# Patient Record
Sex: Female | Born: 1965
Health system: Southern US, Community
[De-identification: ages and names within clinical notes are randomized; demographics above are authoritative.]

## PROBLEM LIST (undated history)

## (undated) DIAGNOSIS — L309 Dermatitis, unspecified: Secondary | ICD-10-CM

## (undated) DIAGNOSIS — A4902 Methicillin resistant Staphylococcus aureus infection, unspecified site: Secondary | ICD-10-CM

---

## 2002-09-12 ENCOUNTER — Other Ambulatory Visit: Admission: RE | Admit: 2002-09-12 | Discharge: 2002-09-12 | Payer: Self-pay | Admitting: Obstetrics and Gynecology

## 2006-02-13 ENCOUNTER — Emergency Department (HOSPITAL_COMMUNITY): Admission: EM | Admit: 2006-02-13 | Discharge: 2006-02-13 | Payer: Self-pay | Admitting: *Deleted

## 2006-04-10 ENCOUNTER — Emergency Department (HOSPITAL_COMMUNITY): Admission: EM | Admit: 2006-04-10 | Discharge: 2006-04-10 | Payer: Self-pay | Admitting: Emergency Medicine

## 2006-06-21 ENCOUNTER — Emergency Department (HOSPITAL_COMMUNITY): Admission: EM | Admit: 2006-06-21 | Discharge: 2006-06-21 | Payer: Self-pay | Admitting: Emergency Medicine

## 2007-07-04 ENCOUNTER — Emergency Department (HOSPITAL_COMMUNITY): Admission: EM | Admit: 2007-07-04 | Discharge: 2007-07-04 | Payer: Self-pay | Admitting: Emergency Medicine

## 2007-07-07 ENCOUNTER — Ambulatory Visit (HOSPITAL_COMMUNITY): Admission: RE | Admit: 2007-07-07 | Discharge: 2007-07-07 | Payer: Self-pay | Admitting: Obstetrics

## 2008-02-02 ENCOUNTER — Emergency Department (HOSPITAL_COMMUNITY): Admission: EM | Admit: 2008-02-02 | Discharge: 2008-02-02 | Payer: Self-pay | Admitting: Emergency Medicine

## 2010-07-25 ENCOUNTER — Encounter: Payer: Self-pay | Admitting: Obstetrics

## 2010-07-26 ENCOUNTER — Encounter: Payer: Self-pay | Admitting: Obstetrics

## 2010-09-17 ENCOUNTER — Inpatient Hospital Stay (INDEPENDENT_AMBULATORY_CARE_PROVIDER_SITE_OTHER)
Admission: RE | Admit: 2010-09-17 | Discharge: 2010-09-17 | Disposition: A | Discharge status: Home / Self Care | Payer: Self-pay | Source: Ambulatory Visit | Attending: Family Medicine | Admitting: Family Medicine

## 2010-09-17 DIAGNOSIS — N760 Acute vaginitis: Secondary | ICD-10-CM

## 2010-09-17 LAB — POCT URINALYSIS DIPSTICK
Bilirubin Urine: NEGATIVE
Glucose, UA: NEGATIVE mg/dL
Hgb urine dipstick: NEGATIVE
Ketones, ur: 40 mg/dL — AB
Nitrite: NEGATIVE
Protein, ur: 30 mg/dL — AB
Specific Gravity, Urine: >=1.03 (ref 1.005–1.030)
Urobilinogen, UA: 2 mg/dL — ABNORMAL HIGH (ref 0.0–1.0)
pH: 6 (ref 5.0–8.0)

## 2010-09-17 LAB — POCT PREGNANCY, URINE: Preg Test, Ur: NEGATIVE

## 2010-09-17 LAB — WET PREP, GENITAL
Trich, Wet Prep: NONE SEEN
Yeast Wet Prep HPF POC: NONE SEEN

## 2010-09-18 LAB — GC/CHLAMYDIA PROBE AMP, GENITAL: Chlamydia, DNA Probe: NEGATIVE

## 2010-11-06 ENCOUNTER — Inpatient Hospital Stay (INDEPENDENT_AMBULATORY_CARE_PROVIDER_SITE_OTHER)
Admission: RE | Admit: 2010-11-06 | Discharge: 2010-11-06 | Disposition: A | Discharge status: Home / Self Care | Payer: Self-pay | Source: Ambulatory Visit | Attending: Emergency Medicine | Admitting: Emergency Medicine

## 2010-11-06 DIAGNOSIS — N390 Urinary tract infection, site not specified: Secondary | ICD-10-CM

## 2010-11-06 LAB — POCT URINALYSIS DIP (DEVICE)
Protein, ur: >=300 mg/dL — AB
Specific Gravity, Urine: 1.025 (ref 1.005–1.030)
Urobilinogen, UA: 1 mg/dL (ref 0.0–1.0)
pH: 5.5 (ref 5.0–8.0)

## 2010-11-06 LAB — POCT PREGNANCY, URINE: Preg Test, Ur: NEGATIVE

## 2012-04-29 ENCOUNTER — Emergency Department (INDEPENDENT_AMBULATORY_CARE_PROVIDER_SITE_OTHER)
Admission: EM | Admit: 2012-04-29 | Discharge: 2012-04-29 | Disposition: A | Discharge status: Home / Self Care | Payer: Self-pay | Source: Home / Self Care | Attending: Family Medicine | Admitting: Family Medicine

## 2012-04-29 ENCOUNTER — Encounter (HOSPITAL_COMMUNITY): Payer: Self-pay | Admitting: Emergency Medicine

## 2012-04-29 DIAGNOSIS — B86 Scabies: Secondary | ICD-10-CM

## 2012-04-29 DIAGNOSIS — N760 Acute vaginitis: Secondary | ICD-10-CM

## 2012-04-29 HISTORY — DX: Dermatitis, unspecified: L30.9

## 2012-04-29 LAB — POCT URINALYSIS DIP (DEVICE)
Bilirubin Urine: NEGATIVE
Ketones, ur: NEGATIVE mg/dL
Protein, ur: NEGATIVE mg/dL
Specific Gravity, Urine: 1.025 (ref 1.005–1.030)
pH: 5.5 (ref 5.0–8.0)

## 2012-04-29 LAB — WET PREP, GENITAL: Trich, Wet Prep: NONE SEEN

## 2012-04-29 MED ORDER — METRONIDAZOLE 500 MG PO TABS: 500.0000 mg | ORAL_TABLET | Freq: Two times a day (BID) | ORAL | Status: DC

## 2012-04-29 MED ORDER — IVERMECTIN 3 MG PO TABS: ORAL_TABLET | ORAL | Status: DC

## 2012-04-29 MED ORDER — FLUCONAZOLE 150 MG PO TABS: ORAL_TABLET | ORAL | Status: DC

## 2012-04-29 MED ORDER — PERMETHRIN 5 % EX CREA: TOPICAL_CREAM | CUTANEOUS | Status: DC

## 2012-04-29 NOTE — ED Notes (Signed)
Pt c/o multiple issues... UTI and vaginal discharge x2 months... Sx include: gray dishcarge, foul odor, dark/cloudy urine, lower back pain... Denies: fevers, vomiting, nauseas, diarrhea... Also c/o rash x3 weeks... Rash is on abd, back, and legs, chest... Pt is alert w/no signs of distress

## 2012-04-29 NOTE — ED Provider Notes (Signed)
History     CSN: [id number]  Arrival date & time 04/29/12  1191   First MD Initiated Contact with Patient 04/29/12 1011      Chief Complaint  Patient presents with  . Vaginal Discharge    (Consider location/radiation/quality/duration/timing/severity/associated sxs/prior treatment) HPI Comments: HPI Comments: 46 y/o female here with the following complaints: 1) Vaginal discharge itchiness and burning for 2 months associated with foul smell. Denies pelvic pain. Reports sporadic unprotected sex but currently abstinent for several months. Denies dysuria although reports concentrated dark urine, no hematuria, flank pain, fever or chills no urgency or frequency. 2) pruriginous rash in lower abdomen and extremities for 3 weeks other family hose contacts with similar symptoms. Has used permethrin last week with no resolution of rash.    Past Medical History  Diagnosis Date  . Eczema     History reviewed. No pertinent past surgical history.  No family history on file.  History  Substance Use Topics  . Smoking status: Never Smoker   . Smokeless tobacco: Not on file  . Alcohol Use: Yes    OB History    Grav Para Term Preterm Abortions TAB SAB Ect Mult Living                  Review of Systems  Constitutional: Negative for fever and chills.  Genitourinary: Positive for vaginal discharge. Negative for dysuria, urgency, frequency, hematuria, flank pain, vaginal bleeding and pelvic pain.  Neurological: Negative for dizziness and headaches.  All other systems reviewed and are negative.    Allergies  Review of patient's allergies indicates no known allergies.  Home Medications   Current Outpatient Rx  Name Route Sig Dispense Refill  . FLUCONAZOLE 150 MG PO TABS  1 tab by mouth q. 72 hours x2 2 tablet 00  . IVERMECTIN 3 MG PO TABS  3 tabs po x1 3 tablet 0  . METRONIDAZOLE 500 MG PO TABS Oral Take 1 tablet (500 mg total) by mouth 2 (two) times daily. 14 tablet 0  .  PERMETHRIN 5 % EX CREA  Apply to affected area once 60 g 1    BP 117/83  Pulse 85  Temp 98.1 F (36.7 C) (Oral)  Resp 18  SpO2 100%  LMP 03/27/2012  Physical Exam  Vitals reviewed. Constitutional: She is oriented to person, place, and time. She appears well-developed and well-nourished. No distress.  HENT:  Head: Normocephalic and atraumatic.  Eyes: Conjunctivae normal are normal. No scleral icterus.  Neck: Neck supple.  Cardiovascular: Normal rate, regular rhythm and normal heart sounds.   No murmur heard. Pulmonary/Chest: Breath sounds normal.  Abdominal: Soft. There is no tenderness. Hernia confirmed negative in the right inguinal area and confirmed negative in the left inguinal area.       No CVT  Genitourinary: There is no rash or lesion on the right labia. There is no rash or lesion on the left labia. Uterus is enlarged. Uterus is not tender. Cervix exhibits no motion tenderness and no friability. Right adnexum displays no mass, no tenderness and no fullness. Left adnexum displays no mass, no tenderness and no fullness. There is erythema around the vagina. Vaginal discharge found.    Lymphadenopathy:    She has no cervical adenopathy.       Right: No inguinal adenopathy present.       Left: No inguinal adenopathy present.  Neurological: She is alert and oriented to person, place, and time.  Skin: She is not diaphoretic.  scoriated pruriginous rash scattered in lower abdomen and distal extremities. No pustules or significant base erythema.     ED Course  Procedures (including critical care time)  Labs Reviewed  POCT URINALYSIS DIP (DEVICE) - Abnormal; Notable for the following:    Hgb urine dipstick TRACE (*)     Leukocytes, UA SMALL (*)  Biochemical Testing Only. Please order routine urinalysis from main lab if confirmatory testing is needed.   All other components within normal limits  WET PREP, GENITAL - Abnormal; Notable for the following:    Yeast Wet Prep  HPF POC MANY (*)     Clue Cells Wet Prep HPF POC MODERATE (*)     WBC, Wet Prep HPF POC MODERATE (*)     All other components within normal limits  POCT PREGNANCY, URINE  GC/CHLAMYDIA PROBE AMP, GENITAL   No results found.   1. Vaginitis   2. Scabies       MDM  Treated empirically with flagyl and diflucan. Gc/Chl pending.  Prescribed permethrin topical and ivermectin oral for resistant scabies. General household measures discussed with patient and provided in writing.         Sharin Grave, MD 04/30/12 [phone]

## 2012-05-01 LAB — GC/CHLAMYDIA PROBE AMP, GENITAL: Chlamydia, DNA Probe: NEGATIVE

## 2012-05-01 NOTE — ED Notes (Signed)
GC/Chlamydia neg., Wet prep: many yeast, mod. clue cells, mod. WBC's.  Pt. adequately treated with Diflucan and Flagyl. Elizabeth Livingston 05/01/2012

## 2013-04-23 ENCOUNTER — Emergency Department (HOSPITAL_COMMUNITY): Payer: Self-pay

## 2013-04-23 ENCOUNTER — Emergency Department (HOSPITAL_COMMUNITY)
Admission: EM | Admit: 2013-04-23 | Discharge: 2013-04-23 | Disposition: A | Discharge status: Home / Self Care | Payer: Self-pay | Attending: Emergency Medicine | Admitting: Emergency Medicine

## 2013-04-23 ENCOUNTER — Encounter (HOSPITAL_COMMUNITY): Payer: Self-pay | Admitting: Emergency Medicine

## 2013-04-23 DIAGNOSIS — M654 Radial styloid tenosynovitis [de Quervain]: Secondary | ICD-10-CM | POA: Insufficient documentation

## 2013-04-23 DIAGNOSIS — Z79899 Other long term (current) drug therapy: Secondary | ICD-10-CM | POA: Insufficient documentation

## 2013-04-23 DIAGNOSIS — Z872 Personal history of diseases of the skin and subcutaneous tissue: Secondary | ICD-10-CM | POA: Insufficient documentation

## 2013-04-23 MED ORDER — IBUPROFEN 400 MG PO TABS
800.0000 mg | ORAL_TABLET | Freq: Once | ORAL | Status: AC
  Administered 2013-04-23: 800 mg via ORAL
  Filled 2013-04-23: qty 2

## 2013-04-23 MED ORDER — NAPROXEN 500 MG PO TABS: 500.0000 mg | ORAL_TABLET | Freq: Two times a day (BID) | ORAL | Status: DC

## 2013-04-23 NOTE — ED Provider Notes (Signed)
CSN: [id number]     Arrival date & time 04/23/13  1027 History  This chart was scribed for non-physician practitioner Coral Ceo, PA-C, working with Flint Melter, MD by Dorothey Baseman, ED Scribe. This patient was seen in room TR06C/TR06C and the patient's care was started at 1:13 PM.    Chief Complaint  Patient presents with  . Wrist Pain    Right wrist   The history is provided by the patient. No language interpreter was used.   HPI Comments: Elizabeth Livingston is a 47 y.o. female who presents to the Emergency Department complaining of a constant progressively worsening pain to the right wrist onset 2 weeks ago. Patient denies any recent or potential injury to the area. Patient reports associated swelling to the area at night. 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 She also states that she will occasionally sleep with her wrist bent, holding her head up. She denies any numbness or weakness, loss of sensation, fever, cough, abdominal pain, nausea, or emesis. Patient denies any pertinent medical history.  Patient is right handed.     Past Medical History  Diagnosis Date  . Eczema    History reviewed. No pertinent past surgical history. No family history on file. History  Substance Use Topics  . Smoking status: Never Smoker   . Smokeless tobacco: Not on file  . Alcohol Use: Yes   OB History   Grav Para Term Preterm Abortions TAB SAB Ect Mult Living                 Review of Systems  Constitutional: Negative for fever, activity change, appetite change and fatigue.  HENT: Negative for rhinorrhea and sore throat.   Respiratory: Negative for cough.   Cardiovascular: Negative for chest pain.  Gastrointestinal: Negative for nausea, vomiting and abdominal pain.  Genitourinary: Negative for dysuria.  Musculoskeletal: Positive for arthralgias, joint swelling and myalgias.  Skin: Negative for color change  and wound.  Neurological: Negative for weakness and numbness.  All other systems reviewed and are negative.   Allergies  Review of patient's allergies indicates no known allergies.  Home Medications   Current Outpatient Rx  Name  Route  Sig  Dispense  Refill  . naproxen sodium (ANAPROX) 220 MG tablet   Oral   Take 440 mg by mouth daily as needed (for pain).          Triage Vitals: BP 129/96  Pulse 88  Resp 18  Ht 4' 11.5" (1.511 m)  Wt 131 lb 3 oz (59.506 kg)  BMI 26.06 kg/m2  SpO2 98%  LMP 03/25/2013  Filed Vitals:   04/23/13 1034 04/23/13 1035 04/23/13 1338  BP: 129/96  123/80  Pulse: 88  80  Temp:   98.3 F (36.8 C)  TempSrc:   Oral  Resp: 18    Height: 4' 11.5" (1.511 m)    Weight:  131 lb 3 oz (59.506 kg)   SpO2: 98%  96%    Physical Exam  Nursing note and vitals reviewed. Constitutional: She is oriented to person, place, and time. She appears well-developed and well-nourished. No distress.  HENT:  Head: Normocephalic and atraumatic.  Eyes: Conjunctivae are normal. Right eye exhibits no discharge. Left eye exhibits no discharge.  Neck: Normal range of motion. Neck supple.  Cardiovascular: Normal rate, regular rhythm and normal heart sounds.  Exam reveals no gallop and  no friction rub.   No murmur heard. Radial pulses present bilaterally  Pulmonary/Chest: Effort normal and breath sounds normal. No respiratory distress. She has no wheezes. She has no rales. She exhibits no tenderness.  Abdominal: Soft. She exhibits no distension. There is no tenderness.  Musculoskeletal: Normal range of motion. She exhibits tenderness. She exhibits no edema.  Positive Finkelstein's test on the right.  Tenderness to palpation overlying the 1st metacarpal on the right.  No snuff box tenderness.  Patient able to flex and extend right wrist without any pain or limitations.  Patient reports increased pain with abduction and adduction of the right wrist, however, has no limitations.   No tenderness to palpation to the wrist, forearm, or elbow on the right.  Grip strength 5/5 on the right.    Neurological: She is alert and oriented to person, place, and time.  Skin: Skin is warm and dry. She is not diaphoretic.  No edema, erythema, ecchymosis, or wounds to the right hand and wrist throughout  Psychiatric: She has a normal mood and affect. Her behavior is normal.    ED Course  Procedures (including critical care time)  DIAGNOSTIC STUDIES: Oxygen Saturation is 98% on room air, normal by my interpretation.    COORDINATION OF CARE: 1:15 PM- Discussed negative x-ray results and that symptoms are likely due to overuse. Will order ibuprofen and discharge patient with Naprosyn to manage symptoms. Advised patient to follow up with the referred hand surgeon if there are any new or worsening symptoms. Discussed treatment plan with patient at bedside and patient verbalized agreement.    Labs Review Labs Reviewed - No data to display  Imaging Review Dg Wrist Complete Right  04/23/2013   CLINICAL DATA:  Pain for 2 weeks, no known injury  EXAM: RIGHT WRIST - COMPLETE 3+ VIEW  COMPARISON:  None.  FINDINGS: There is no evidence of fracture or dislocation. There is no evidence of arthropathy or other focal bone abnormality. Soft tissues are unremarkable.  IMPRESSION: Negative.   Electronically Signed   By: Natasha Mead M.D.   On: 04/23/2013 12:44    EKG Interpretation   None       MDM   1. De Quervain's tenosynovitis, right    Elizabeth Livingston is a 47 y.o. female who presents to the Emergency Department complaining of a constant pain to the right wrist onset 2 weeks ago.  Ibuprofen ordered for pain.  X-rays right wrist ordered.    Rechecks  2:00 PM = Splint applied.  Neurovascularly intact.     Etiology of right wrist pain is possibly due to [employer].  Positive finkelstein's test.  Patient neurovascularly intact.  No evidence of fx or dislocation on x-ray.   Patient given Velcro thumb spica splint and work excuse.  She was instructed to follow-up with orthopedics.  Patient was prescribed Naprosyn for outpatient management.  Patient was instructed to return to the ED if they experience any weakness, loss of sensation, cyanosis, or other concerns.  Patient was in agreement with discharge and plan.     Final impressions: 1. De Quervain's tenosynovitis, right     Luiz Iron PA-C   I personally performed the services described in this documentation, which was scribed in my presence. The recorded information has been reviewed and is accurate.         Jillyn Ledger, PA-C 04/26/13 1355

## 2013-04-23 NOTE — ED Notes (Signed)
Pt c/o right wrist pain x 2 weeks. Pt denies recent injury. Pt tearful at triage. Pt reports her job requires here to use a hand held scanner and could be cause of her pain. Pt also reports sometime she sleeps with her wrist bent holding her head.

## 2013-04-23 NOTE — ED Notes (Signed)
Pt c/o right wrist pain x 2 weeks. Worse at night. Works using labeling gun.

## 2013-04-25 ENCOUNTER — Telehealth (HOSPITAL_COMMUNITY): Payer: Self-pay | Admitting: *Deleted

## 2013-04-25 NOTE — ED Notes (Signed)
Pt needs work note faxed to her job.

## 2013-04-25 NOTE — ED Notes (Signed)
Work note faxed to [employer] at [phone]

## 2013-04-27 NOTE — ED Provider Notes (Signed)
Medical screening examination/treatment/procedure(s) were performed by non-physician practitioner and as supervising physician I was immediately available for consultation/collaboration.  Flint Melter, MD 04/27/13 [phone]

## 2013-04-29 ENCOUNTER — Encounter (HOSPITAL_COMMUNITY): Payer: Self-pay | Admitting: Emergency Medicine

## 2013-04-29 ENCOUNTER — Emergency Department (HOSPITAL_COMMUNITY)
Admission: EM | Admit: 2013-04-29 | Discharge: 2013-04-30 | Disposition: A | Discharge status: Home / Self Care | Payer: Self-pay | Attending: Emergency Medicine | Admitting: Emergency Medicine

## 2013-04-29 DIAGNOSIS — J069 Acute upper respiratory infection, unspecified: Secondary | ICD-10-CM | POA: Insufficient documentation

## 2013-04-29 DIAGNOSIS — J029 Acute pharyngitis, unspecified: Secondary | ICD-10-CM | POA: Insufficient documentation

## 2013-04-29 DIAGNOSIS — Z791 Long term (current) use of non-steroidal anti-inflammatories (NSAID): Secondary | ICD-10-CM | POA: Insufficient documentation

## 2013-04-29 DIAGNOSIS — Z872 Personal history of diseases of the skin and subcutaneous tissue: Secondary | ICD-10-CM | POA: Insufficient documentation

## 2013-04-29 NOTE — ED Notes (Signed)
C/o nasal congestion, nasal congestion noted. Also hard to swallow. Throat was sore last night, denies throat pain at this time. Blowing nose in triage, denies fever.

## 2013-04-30 MED ORDER — IPRATROPIUM BROMIDE 0.06 % NA SOLN
2.0000 | Freq: Three times a day (TID) | NASAL | Status: DC
  Administered 2013-04-30: 2 via NASAL
  Filled 2013-04-30: qty 15

## 2013-04-30 NOTE — ED Provider Notes (Signed)
CSN: [id number]     Arrival date & time 04/29/13  2333 History   First MD Initiated Contact with Patient 04/29/13 2351     Chief Complaint  Patient presents with  . Nasal Congestion   (Consider location/radiation/quality/duration/timing/severity/associated sxs/prior Treatment) HPI Comments: Patient with 3, days of nasal congestion, states, that she is having hard time breathing through her nose.  Yesterday.  She had pain with swallowing, and can't "strangled" when she drinks something yesterday.  No further, episodes.  She's taken Advil with little relief of her congestion.  She tried Mucinex once without relief of her congestion.  Denies any fever or myalgias, coughing, shortness of breath  The history is provided by the patient.    Past Medical History  Diagnosis Date  . Eczema    History reviewed. No pertinent past surgical history. No family history on file. History  Substance Use Topics  . Smoking status: Never Smoker   . Smokeless tobacco: Not on file  . Alcohol Use: Yes   OB History   Grav Para Term Preterm Abortions TAB SAB Ect Mult Living                 Review of Systems  Unable to perform ROS Constitutional: Negative for fever and chills.  HENT: Positive for congestion and sore throat. Negative for ear pain, facial swelling, sinus pressure, sneezing and trouble swallowing.   Respiratory: Negative for cough and shortness of breath.   Neurological: Negative for dizziness and headaches.  All other systems reviewed and are negative.    Allergies  Review of patient's allergies indicates no known allergies.  Home Medications   Current Outpatient Rx  Name  Route  Sig  Dispense  Refill  . naproxen (NAPROSYN) 500 MG tablet   Oral   Take 1 tablet (500 mg total) by mouth 2 (two) times daily with a meal.   30 tablet   0   . naproxen sodium (ANAPROX) 220 MG tablet   Oral   Take 440 mg by mouth daily as needed (for pain).          BP 126/71  Temp(Src) 97.6  F (36.4 C) (Oral)  Resp 18  SpO2 98%  LMP 04/23/2013 Physical Exam  Nursing note and vitals reviewed. Constitutional: She appears well-developed and well-nourished.  HENT:  Head: Normocephalic.  Right Ear: External ear normal.  Left Ear: External ear normal.  Nose: Nose normal. No sinus tenderness. Right sinus exhibits no maxillary sinus tenderness and no frontal sinus tenderness. Left sinus exhibits no maxillary sinus tenderness and no frontal sinus tenderness.  Mouth/Throat: Posterior oropharyngeal erythema present. No oropharyngeal exudate or posterior oropharyngeal edema.  Neck: Normal range of motion.  Pulmonary/Chest: Effort normal. She has no wheezes. She exhibits no tenderness.  Lymphadenopathy:    She has no cervical adenopathy.  Neurological: She is alert.  Skin: Skin is warm.    ED Course  Procedures (including critical care time) Labs Review Labs Reviewed - No data to display Imaging Review No results found.  EKG Interpretation   None       MDM   1. URI (upper respiratory infection)     She has been provided with an Atrovent nasal spray to help with her nasal congestion    Arman Filter, NP 04/30/13 0017

## 2013-05-01 NOTE — ED Provider Notes (Signed)
Medical screening examination/treatment/procedure(s) were performed by non-physician practitioner and as supervising physician I was immediately available for consultation/collaboration.  EKG Interpretation   None         Laray Anger, DO 05/01/13 [phone]

## 2013-06-19 ENCOUNTER — Ambulatory Visit: Payer: Self-pay | Admitting: Internal Medicine

## 2013-10-14 ENCOUNTER — Encounter (HOSPITAL_COMMUNITY): Payer: Self-pay | Admitting: Emergency Medicine

## 2013-10-14 ENCOUNTER — Emergency Department (HOSPITAL_COMMUNITY)
Admission: EM | Admit: 2013-10-14 | Discharge: 2013-10-14 | Disposition: A | Discharge status: Home / Self Care | Payer: Self-pay | Attending: Emergency Medicine | Admitting: Emergency Medicine

## 2013-10-14 DIAGNOSIS — IMO0001 Reserved for inherently not codable concepts without codable children: Secondary | ICD-10-CM

## 2013-10-14 DIAGNOSIS — L03019 Cellulitis of unspecified finger: Principal | ICD-10-CM | POA: Insufficient documentation

## 2013-10-14 DIAGNOSIS — Z872 Personal history of diseases of the skin and subcutaneous tissue: Secondary | ICD-10-CM | POA: Insufficient documentation

## 2013-10-14 DIAGNOSIS — Z8614 Personal history of Methicillin resistant Staphylococcus aureus infection: Secondary | ICD-10-CM | POA: Insufficient documentation

## 2013-10-14 DIAGNOSIS — L02519 Cutaneous abscess of unspecified hand: Secondary | ICD-10-CM | POA: Insufficient documentation

## 2013-10-14 DIAGNOSIS — Z792 Long term (current) use of antibiotics: Secondary | ICD-10-CM | POA: Insufficient documentation

## 2013-10-14 MED ORDER — SULFAMETHOXAZOLE-TRIMETHOPRIM 800-160 MG PO TABS: 1.0000 | ORAL_TABLET | Freq: Two times a day (BID) | ORAL | Status: DC

## 2013-10-14 NOTE — Discharge Instructions (Signed)

## 2013-10-14 NOTE — ED Notes (Signed)
Patient presents to ED via POV with complaints of right hand index finger wound. Patient states she does not know how wound happened, "it just appeared over night" , pt states it had been about a week. Blister to index finger, pt states the blister popped but it came back.

## 2013-10-14 NOTE — ED Provider Notes (Signed)
Medical screening examination/treatment/procedure(s) were performed by non-physician practitioner and as supervising physician I was immediately available for consultation/collaboration.   EKG Interpretation None      Devoria AlbeIva Cambreigh Dearing, MD, Armando GangFACEP   Ward GivensIva L Jaleesa Cervi, MD 10/14/13 1544

## 2013-10-14 NOTE — ED Notes (Signed)
Pt discharged to home with family. NAD.  

## 2013-10-14 NOTE — ED Provider Notes (Signed)
CSN: [id number]     Arrival date & time 10/14/13  4098 History  This chart was scribed for non-physician practitioner, Junius Finner, PA-C working with Ward Givens, MD by Greggory Stallion, ED scribe. This patient was seen in room TR06C/TR06C and the patient's care was started at 8:58 AM.   Chief Complaint  Patient presents with  . Finger Injury   The history is provided by the patient. No language interpreter was used.   HPI Comments: Elizabeth Livingston is a 48 y.o. female with history of MRSA who presents to the Emergency Department complaining of proximal right index finger abscess that started 4 days ago. Pt states it drained pus, went away and returned a few days later. Pain is intermittent, moderate in severity, aching with occasional sharp pain.  Denies injury to her finger. She states she has gotten the same thing on her leg in the past. Denies fever. Denies history of diabetes.  Past Medical History  Diagnosis Date  . Eczema    History reviewed. No pertinent past surgical history. History reviewed. No pertinent family history. History  Substance Use Topics  . Smoking status: Never Smoker   . Smokeless tobacco: Not on file  . Alcohol Use: Yes   OB History   Grav Para Term Preterm Abortions TAB SAB Ect Mult Living                 Review of Systems  Constitutional: Negative for fever.  Skin:       Abscess.  All other systems reviewed and are negative.  Allergies  Review of patient's allergies indicates no known allergies.  Home Medications   Current Outpatient Rx  Name  Route  Sig  Dispense  Refill  . sulfamethoxazole-trimethoprim (SEPTRA DS) 800-160 MG per tablet   Oral   Take 1 tablet by mouth every 12 (twelve) hours.   10 tablet   0    BP 118/80  Pulse 72  Temp(Src) 98.4 F (36.9 C) (Oral)  Resp 20  Wt 123 lb 7 oz (55.991 kg)  SpO2 98%  Physical Exam  Nursing note and vitals reviewed. Constitutional: She is oriented to person, place, and time. She appears  well-developed and well-nourished.  HENT:  Head: Normocephalic and atraumatic.  Eyes: EOM are normal.  Neck: Normal range of motion.  Cardiovascular: Normal rate.   Pulmonary/Chest: Effort normal.  Musculoskeletal: Normal range of motion.  0.5 cm area of erythema and tenderness to right index finger. No active drainage or red streaking. Mild fluctuance. Full ROM. Capillary refill less than 2 seconds.   Neurological: She is alert and oriented to person, place, and time.  Skin: Skin is warm and dry.  Psychiatric: She has a normal mood and affect. Her behavior is normal.    ED Course  Procedures (including critical care time)  DIAGNOSTIC STUDIES: Oxygen Saturation is 98% on RA, normal by my interpretation.    COORDINATION OF CARE: 9:01 AM-Discussed treatment plan which includes I&D, Bactrim and warm compresses with pt at bedside and pt agreed to plan.    INCISION AND DRAINAGE Performed by: Junius Finner, PA-C Consent: Verbal consent obtained. Risks and benefits: risks, benefits and alternatives were discussed Type: abscess  Body area: right index finger  Anesthesia: local infiltration  Incision was made with a 21 gauge needle.  Local anesthetic: none  Anesthetic total: 0 ml  Complexity: complex  Drainage: purulent  Drainage amount: scant  Packing material: none  Patient tolerance: Patient tolerated the procedure  well with no immediate complications.  Labs Review Labs Reviewed - No data to display Imaging Review No results found.   EKG Interpretation None      MDM   Final diagnoses:  Abscess of second finger of right hand    Pt presenting with 0.5cm of proximal right index finger.  I&D successfully performed with 21gtt needle.  Bacitracin and bandage placed. Rx: bactrim. [facility] Return precautions provided. Pt verbalized understanding and agreement with tx plan.    I personally performed the  services described in this documentation, which was scribed in my presence. The recorded information has been reviewed and is accurate.  Junius Finnerrin O'Malley, PA-C 10/14/13 1055

## 2017-12-13 ENCOUNTER — Emergency Department (HOSPITAL_BASED_OUTPATIENT_CLINIC_OR_DEPARTMENT_OTHER)
Admission: EM | Admit: 2017-12-13 | Discharge: 2017-12-13 | Disposition: A | Discharge status: Home / Self Care | Payer: Self-pay | Attending: Emergency Medicine | Admitting: Emergency Medicine

## 2017-12-13 ENCOUNTER — Other Ambulatory Visit: Payer: Self-pay

## 2017-12-13 ENCOUNTER — Encounter (HOSPITAL_BASED_OUTPATIENT_CLINIC_OR_DEPARTMENT_OTHER): Payer: Self-pay

## 2017-12-13 DIAGNOSIS — F17200 Nicotine dependence, unspecified, uncomplicated: Secondary | ICD-10-CM | POA: Insufficient documentation

## 2017-12-13 DIAGNOSIS — R21 Rash and other nonspecific skin eruption: Secondary | ICD-10-CM

## 2017-12-13 HISTORY — DX: Methicillin resistant Staphylococcus aureus infection, unspecified site: A49.02

## 2017-12-13 MED ORDER — HYDROXYZINE HCL 25 MG PO TABS: 12.5000 mg | ORAL_TABLET | Freq: Three times a day (TID) | ORAL | 0 refills | Status: DC | PRN

## 2017-12-13 MED ORDER — CLOTRIMAZOLE 1 % EX CREA: TOPICAL_CREAM | CUTANEOUS | 0 refills | Status: AC

## 2017-12-13 NOTE — Discharge Instructions (Signed)
Follow up with your PCP this week.  If you develop worsening or new concerning symptoms you can return to the emergency department for re-evaluation.

## 2017-12-13 NOTE — ED Provider Notes (Signed)
[facility] EMERGENCY DEPARTMENT Provider Note   CSN: [id number] Arrival date & time: 12/13/17  2022     History   Chief Complaint Chief Complaint  Patient presents with  . Rash    HPI Elizabeth Livingston is a 52 y.o. female with a history of eczema who presents to the ED for rash.  She reports that she is noticed a scaly, pruritic rash on the trunk of her body over the last 2 weeks.  She reports she has been trying her hydrocortisone cream that she normally uses for eczema without any relief.  She states this is different than the eczema she has had in the past. Denies fever, chills, contacts with persons with similar rash, or any changes in lotions/soaps/detergents, exposure to animal or plant irritants. No swelling or purulent discharge. No redness, heat or warmth. No new medications. No recent travel. No recent tick bites. No involvement to palms/soles or between webspaces. Patient does not have history of  Immunocompromised. Denies hx of DM, chronic steroid use.  No lip swelling, tongue swelling, difficulty swallowing, difficult to breathing, abdominal cramping, nausea/vomiting/diarrhea.  HPI  Past Medical History:  Diagnosis Date  . Eczema   . MRSA (methicillin resistant Staphylococcus aureus)     There are no active problems to display for this patient.   History reviewed. No pertinent surgical history.   OB History   None      Home Medications    Prior to Admission medications   Not on File    Family History No family history on file.  Social History Social History   Tobacco Use  . Smoking status: Current Every Day Smoker  . Smokeless tobacco: Never Used  Substance Use Topics  . Alcohol use: Yes    Comment: occ  . Drug use: No     Allergies   Patient has no known allergies.   Review of Systems Review of Systems  All other systems reviewed and are negative.    Physical Exam Updated Vital Signs BP (!) 162/99 (BP Location: Left Arm)    Pulse 87   Temp 97.8 F (36.6 C) (Oral)   Resp 18   Ht 4\' 11"  (1.499 m)   Wt 59.4 kg (131 lb)   SpO2 99%   BMI 26.46 kg/m   Physical Exam  Constitutional: She appears well-developed and well-nourished.  HENT:  Head: Normocephalic and atraumatic.  Right Ear: External ear normal.  Left Ear: External ear normal.  Nose: Nose normal.  Mouth/Throat: Uvula is midline, oropharynx is clear and moist and mucous membranes are normal. No tonsillar exudate.  No angioedema.  No lip swelling, tongue swelling or uvular swelling.  Normal phonation.  Patient is in control secretions.  No stridor.  Eyes: Pupils are equal, round, and reactive to light. Right eye exhibits no discharge. Left eye exhibits no discharge. No scleral icterus.  Neck: Trachea normal. Neck supple. No spinous process tenderness present. No neck rigidity. Normal range of motion present.  Cardiovascular: Normal rate, regular rhythm and intact distal pulses.  No murmur heard. Pulses:      Radial pulses are 2+ on the right side, and 2+ on the left side.       Dorsalis pedis pulses are 2+ on the right side, and 2+ on the left side.       Posterior tibial pulses are 2+ on the right side, and 2+ on the left side.  No lower extremity swelling or edema. Calves symmetric in size  bilaterally.  Pulmonary/Chest: Effort normal and breath sounds normal. She exhibits no tenderness.  Abdominal: Soft. Bowel sounds are normal. There is no tenderness. There is no rebound and no guarding.  Musculoskeletal: She exhibits no edema.  Lymphadenopathy:    She has no cervical adenopathy.  Neurological: She is alert.  Skin: Skin is warm and dry. No rash noted. She is not diaphoretic.  Multiple areas on patients trunk with scaly, skin tone in appearance rings with central clearing. No blisters, no pustules, no warmth, no draining sinus tracts, no superficial abscesses, no bullous impetigo, no vesicles, no desquamation, no target lesions with dusky purpura  or a central bulla. Not tender to touch. No involvement of palms, soles or between webspace's.   Psychiatric: She has a normal mood and affect.  Nursing note and vitals reviewed.    ED Treatments / Results  Labs (all labs ordered are listed, but only abnormal results are displayed) Labs Reviewed - No data to display  EKG None  Radiology No results found.  Procedures Procedures (including critical care time)  Medications Ordered in ED Medications - No data to display   Initial Impression / Assessment and Plan / ED Course  I have reviewed the triage vital signs and the nursing notes.  Pertinent labs & imaging results that were available during my care of the patient were reviewed by me and considered in my medical decision making (see chart for details).     Rash consistent with Tinea. Patient denies any difficulty breathing or swallowing.  Pt has a patent airway without stridor and is handling secretions without difficulty; no angioedema. No blisters, no pustules, no warmth, no draining sinus tracts, no superficial abscesses, no bullous impetigo, no vesicles, no desquamation, no target lesions with dusky purpura or a central bulla. Not tender to touch. No concern for superimposed infection. No concern for SJS, TEN, TSS, tick borne illness, syphilis or other life-threatening condition. Will discharge home with antifungal cream and atarax (for itching). I advised the patient to follow-up with PCP this week. Specific return precautions discussed. Time was given for all questions to be answered. The patient verbalized understanding and agreement with plan. The patient appears safe for discharge home.  Final Clinical Impressions(s) / ED Diagnoses   Final diagnoses:  Rash    ED Discharge Orders        Ordered    clotrimazole (LOTRIMIN) 1 % cream     12/13/17 2346    hydrOXYzine (ATARAX/VISTARIL) 25 MG tablet  Every 8 hours PRN     12/13/17 2346       Princella PellegriniMaczis, Sequan Auxier M,  PA-C 12/14/17 1106    Tegeler, Canary Brimhristopher J, MD 12/18/17 1339

## 2017-12-13 NOTE — ED Triage Notes (Signed)
C/o scattered rash x 2 weeks-NAD-steady gait 

## 2017-12-13 NOTE — ED Notes (Signed)
Pt verbalizes understanding of dc instructions and denies any further needs at this time.  Pt is encouraged to establish herself with a PCP and shown the phone numbers listed in her dc paperwork.

## 2018-01-17 ENCOUNTER — Ambulatory Visit: Payer: Self-pay | Admitting: Family Medicine

## 2018-01-23 ENCOUNTER — Ambulatory Visit: Payer: BLUE CROSS/BLUE SHIELD | Admitting: Family Medicine

## 2018-01-23 ENCOUNTER — Encounter: Payer: Self-pay | Admitting: Family Medicine

## 2018-01-23 VITALS — BP 149/101 | HR 79 | Temp 97.9°F | Ht 59.5 in | Wt 134.0 lb

## 2018-01-23 DIAGNOSIS — I10 Essential (primary) hypertension: Secondary | ICD-10-CM | POA: Diagnosis not present

## 2018-01-23 DIAGNOSIS — R319 Hematuria, unspecified: Secondary | ICD-10-CM | POA: Diagnosis not present

## 2018-01-23 DIAGNOSIS — R21 Rash and other nonspecific skin eruption: Secondary | ICD-10-CM | POA: Diagnosis not present

## 2018-01-23 DIAGNOSIS — R8290 Unspecified abnormal findings in urine: Secondary | ICD-10-CM

## 2018-01-23 LAB — COMPREHENSIVE METABOLIC PANEL
ALK PHOS: 64 U/L (ref 39–117)
ALT: 19 U/L (ref 0–35)
AST: 12 U/L (ref 0–37)
Albumin: 4.3 g/dL (ref 3.5–5.2)
BILIRUBIN TOTAL: 0.6 mg/dL (ref 0.2–1.2)
BUN: 17 mg/dL (ref 6–23)
CALCIUM: 9.7 mg/dL (ref 8.4–10.5)
CO2: 30 mEq/L (ref 19–32)
Chloride: 104 mEq/L (ref 96–112)
Creatinine, Ser: 0.89 mg/dL (ref 0.40–1.20)
GFR: 85.7 mL/min (ref >60.00)
GLUCOSE: 81 mg/dL (ref 70–99)
POTASSIUM: 5.1 meq/L (ref 3.5–5.1)
Sodium: 140 mEq/L (ref 135–145)
TOTAL PROTEIN: 8.2 g/dL (ref 6.0–8.3)

## 2018-01-23 LAB — CBC WITH DIFFERENTIAL/PLATELET
BASOS ABS: 0 10*3/uL (ref 0.0–0.1)
Basophils Relative: 0.6 % (ref 0.0–3.0)
EOS PCT: 3.4 % (ref 0.0–5.0)
Eosinophils Absolute: 0.2 10*3/uL (ref 0.0–0.7)
HCT: 42.1 % (ref 36.0–46.0)
Hemoglobin: 13.9 g/dL (ref 12.0–15.0)
LYMPHS ABS: 2.6 10*3/uL (ref 0.7–4.0)
LYMPHS PCT: 35.5 % (ref 12.0–46.0)
MCHC: 33 g/dL (ref 30.0–36.0)
MCV: 97.2 fl (ref 78.0–100.0)
MONOS PCT: 11.5 % (ref 3.0–12.0)
Monocytes Absolute: 0.9 10*3/uL (ref 0.1–1.0)
NEUTROS PCT: 49 % (ref 43.0–77.0)
Neutro Abs: 3.6 10*3/uL (ref 1.4–7.7)
Platelets: 241 10*3/uL (ref 150.0–400.0)
RBC: 4.33 Mil/uL (ref 3.87–5.11)
RDW: 13.9 % (ref 11.5–15.5)
WBC: 7.4 10*3/uL (ref 4.0–10.5)

## 2018-01-23 LAB — TSH: TSH: 1.83 u[IU]/mL (ref 0.35–4.50)

## 2018-01-23 LAB — LIPID PANEL
Cholesterol: 161 mg/dL (ref 0–200)
HDL: 35.4 mg/dL — AB (ref >39.00)
LDL Cholesterol: 106 mg/dL — ABNORMAL HIGH (ref 0–99)
NONHDL: 125.55
TRIGLYCERIDES: 96 mg/dL (ref 0.0–149.0)
Total CHOL/HDL Ratio: 5
VLDL: 19.2 mg/dL (ref 0.0–40.0)

## 2018-01-23 MED ORDER — PREDNISONE 10 MG PO TABS: ORAL_TABLET | ORAL | 0 refills | Status: DC

## 2018-01-23 MED ORDER — DOXYCYCLINE HYCLATE 100 MG PO TABS: 100.0000 mg | ORAL_TABLET | Freq: Two times a day (BID) | ORAL | 0 refills | Status: DC

## 2018-01-23 MED ORDER — AMLODIPINE BESYLATE 2.5 MG PO TABS
2.5000 mg | ORAL_TABLET | Freq: Every day | ORAL | 3 refills | Status: DC
Start: 2018-01-23 — End: 2018-02-28

## 2018-01-23 NOTE — Patient Instructions (Addendum)
DASH Eating Plan DASH stands for "Dietary Approaches to Stop Hypertension." The DASH eating plan is a healthy eating plan that has been shown to reduce high blood pressure (hypertension). It may also reduce your risk for type 2 diabetes, heart disease, and stroke. The DASH eating plan may also help with weight loss. What are tips for following this plan? General guidelines  Avoid eating more than 2,300 mg (milligrams) of salt (sodium) a day. If you have hypertension, you may need to reduce your sodium intake to 1,500 mg a day.  Limit alcohol intake to no more than 1 drink a day for nonpregnant women and 2 drinks a day for men. One drink equals 12 oz of beer, 5 oz of wine, or 1 oz of hard liquor.  Work with your health care provider to maintain a healthy body weight or to lose weight. Ask what an ideal weight is for you.  Get at least 30 minutes of exercise that causes your heart to beat faster (aerobic exercise) most days of the week. Activities may include walking, swimming, or biking.  Work with your health care provider or diet and nutrition specialist (dietitian) to adjust your eating plan to your individual calorie needs. Reading food labels  Check food labels for the amount of sodium per serving. Choose foods with less than 5 percent of the Daily Value of sodium. Generally, foods with less than 300 mg of sodium per serving fit into this eating plan.  To find whole grains, look for the word "whole" as the first word in the ingredient list. Shopping  Buy products labeled as "low-sodium" or "no salt added."  Buy fresh foods. Avoid canned foods and premade or frozen meals. Cooking  Avoid adding salt when cooking. Use salt-free seasonings or herbs instead of table salt or sea salt. Check with your health care provider or pharmacist before using salt substitutes.  Do not fry foods. Cook foods using healthy methods such as baking, boiling, grilling, and broiling instead.  Cook with  heart-healthy oils, such as olive, canola, soybean, or sunflower oil. Meal planning   Eat a balanced diet that includes: ? 5 or more servings of fruits and vegetables each day. At each meal, try to fill half of your plate with fruits and vegetables. ? Up to 6-8 servings of whole grains each day. ? Less than 6 oz of lean meat, poultry, or fish each day. A 3-oz serving of meat is about the same size as a deck of cards. One egg equals 1 oz. ? 2 servings of low-fat dairy each day. ? A serving of nuts, seeds, or beans 5 times each week. ? Heart-healthy fats. Healthy fats called Omega-3 fatty acids are found in foods such as flaxseeds and coldwater fish, like sardines, salmon, and mackerel.  Limit how much you eat of the following: ? Canned or prepackaged foods. ? Food that is high in trans fat, such as fried foods. ? Food that is high in saturated fat, such as fatty meat. ? Sweets, desserts, sugary drinks, and other foods with added sugar. ? Full-fat dairy products.  Do not salt foods before eating.  Try to eat at least 2 vegetarian meals each week.  Eat more home-cooked food and less restaurant, buffet, and fast food.  When eating at a restaurant, ask that your food be prepared with less salt or no salt, if possible. What foods are recommended? The items listed may not be a complete list. Talk with your dietitian about what   dietary choices are best for you. Grains Whole-grain or whole-wheat bread. Whole-grain or whole-wheat pasta. Brown rice. Oatmeal. Quinoa. Bulgur. Whole-grain and low-sodium cereals. Pita bread. Low-fat, low-sodium crackers. Whole-wheat flour tortillas. Vegetables Fresh or frozen vegetables (raw, steamed, roasted, or grilled). Low-sodium or reduced-sodium tomato and vegetable juice. Low-sodium or reduced-sodium tomato sauce and tomato paste. Low-sodium or reduced-sodium canned vegetables. Fruits All fresh, dried, or frozen fruit. Canned fruit in natural juice (without  added sugar). Meat and other protein foods Skinless chicken or turkey. Ground chicken or turkey. Pork with fat trimmed off. Fish and seafood. Egg whites. Dried beans, peas, or lentils. Unsalted nuts, nut butters, and seeds. Unsalted canned beans. Lean cuts of beef with fat trimmed off. Low-sodium, lean deli meat. Dairy Low-fat (1%) or fat-free (skim) milk. Fat-free, low-fat, or reduced-fat cheeses. Nonfat, low-sodium ricotta or cottage cheese. Low-fat or nonfat yogurt. Low-fat, low-sodium cheese. Fats and oils Soft margarine without trans fats. Vegetable oil. Low-fat, reduced-fat, or light mayonnaise and salad dressings (reduced-sodium). Canola, safflower, olive, soybean, and sunflower oils. Avocado. Seasoning and other foods Herbs. Spices. Seasoning mixes without salt. Unsalted popcorn and pretzels. Fat-free sweets. What foods are not recommended? The items listed may not be a complete list. Talk with your dietitian about what dietary choices are best for you. Grains Baked goods made with fat, such as croissants, muffins, or some breads. Dry pasta or rice meal packs. Vegetables Creamed or fried vegetables. Vegetables in a cheese sauce. Regular canned vegetables (not low-sodium or reduced-sodium). Regular canned tomato sauce and paste (not low-sodium or reduced-sodium). Regular tomato and vegetable juice (not low-sodium or reduced-sodium). Pickles. Olives. Fruits Canned fruit in a light or heavy syrup. Fried fruit. Fruit in cream or butter sauce. Meat and other protein foods Fatty cuts of meat. Ribs. Fried meat. Bacon. Sausage. Bologna and other processed lunch meats. Salami. Fatback. Hotdogs. Bratwurst. Salted nuts and seeds. Canned beans with added salt. Canned or smoked fish. Whole eggs or egg yolks. Chicken or turkey with skin. Dairy Whole or 2% milk, cream, and half-and-half. Whole or full-fat cream cheese. Whole-fat or sweetened yogurt. Full-fat cheese. Nondairy creamers. Whipped toppings.  Processed cheese and cheese spreads. Fats and oils Butter. Stick margarine. Lard. Shortening. Ghee. Bacon fat. Tropical oils, such as coconut, palm kernel, or palm oil. Seasoning and other foods Salted popcorn and pretzels. Onion salt, garlic salt, seasoned salt, table salt, and sea salt. Worcestershire sauce. Tartar sauce. Barbecue sauce. Teriyaki sauce. Soy sauce, including reduced-sodium. Steak sauce. Canned and packaged gravies. Fish sauce. Oyster sauce. Cocktail sauce. Horseradish that you find on the shelf. Ketchup. Mustard. Meat flavorings and tenderizers. Bouillon cubes. Hot sauce and Tabasco sauce. Premade or packaged marinades. Premade or packaged taco seasonings. Relishes. Regular salad dressings. Where to find more information:  National Heart, Lung, and Blood Institute: www.nhlbi.nih.gov  American Heart Association: www.heart.org Summary  The DASH eating plan is a healthy eating plan that has been shown to reduce high blood pressure (hypertension). It may also reduce your risk for type 2 diabetes, heart disease, and stroke.  With the DASH eating plan, you should limit salt (sodium) intake to 2,300 mg a day. If you have hypertension, you may need to reduce your sodium intake to 1,500 mg a day.  When on the DASH eating plan, aim to eat more fresh fruits and vegetables, whole grains, lean proteins, low-fat dairy, and heart-healthy fats.  Work with your health care provider or diet and nutrition specialist (dietitian) to adjust your eating plan to your individual   calorie needs. This information is not intended to replace advice given to you by your health care provider. Make sure you discuss any questions you have with your health care provider. Document Released: 06/10/2011 Document Revised: 06/14/2016 Document Reviewed: 06/14/2016 Elsevier Interactive Patient Education  2018 [employer]        Rash A rash is a change in the color of the skin. A rash can also change the way  your skin feels. There are many different conditions and factors that can cause a rash. Follow these instructions at home: Pay attention to any changes in your symptoms. Follow these instructions to help with your condition: Medicine Take or apply over-the-counter and prescription medicines only as told by your health care provider. These may include:  Corticosteroid cream.  Anti-itch lotions.  Oral antihistamines.  Skin Care  Apply cool compresses to the affected areas.  Try taking a bath with: ? Epsom salts. Follow the instructions on the packaging. You can get these at your local pharmacy or grocery store. ? Baking soda. Pour a small amount into the bath as told by your health care provider. ? Colloidal oatmeal. Follow the instructions on the packaging. You can get this at your local pharmacy or grocery store.  Try applying baking soda paste to your skin. Stir water into baking soda until it reaches a paste-like consistency.  Do not scratch or rub your skin.  Avoid covering the rash. Make sure the rash is exposed to air as much as possible. General instructions  Avoid hot showers or baths, which can make itching worse. A cold shower may help.  Avoid scented soaps, detergents, and perfumes. Use gentle soaps, detergents, perfumes, and other cosmetic products.  Avoid any substance that causes your rash. Keep a journal to help track what causes your rash. Write down: ? What you eat. ? What cosmetic products you use. ? What you drink. ? What you wear. This includes jewelry.  Keep all follow-up visits as told by your health care provider. This is important. Contact a health care provider if:  You sweat at night.  You lose weight.  You urinate more than normal.  You feel weak.  You vomit.  Your skin or the whites of your eyes look yellow (jaundice).  Your skin: ? Tingles. ? Is numb.  Your rash: ? Does not go away after several days. ? Gets worse.  You  are: ? Unusually thirsty. ? More tired than normal.  You have: ? New symptoms. ? Pain in your abdomen. ? A fever. ? Diarrhea. Get help right away if:  You develop a rash that covers all or most of your body. The rash may or may not be painful.  You develop blisters that: ? Are on top of the rash. ? Grow larger or grow together. ? Are painful. ? Are inside your nose or mouth.  You develop a rash that: ? Looks like purple pinprick-sized spots all over your body. ? Has a "bull's eye" or looks like a target. ? Is not related to sun exposure, is red and painful, and causes your skin to peel. This information is not intended to replace advice given to you by your health care provider. Make sure you discuss any questions you have with your health care provider. Document Released: 06/11/2002 Document Revised: 11/25/2015 Document Reviewed: 11/06/2014 Elsevier Interactive Patient Education  2018 [employer]

## 2018-01-23 NOTE — Assessment & Plan Note (Signed)
Poorly controlled will alter medications, encouraged DASH diet, minimize caffeine and obtain adequate sleep. Report concerning symptoms and follow up as directed and as needed\ meds started today rto 2-3 weeks or sooner prn

## 2018-01-23 NOTE — Progress Notes (Signed)
Patient ID: Elizabeth ShellGabocia L Moomaw, female    DOB: 1965/08/18  Age: 52 y.o. MRN: [id number]    Subjective:  Subjective  HPI  Elizabeth Livingston presents to establish as a new pt and c/o  rash that has been on low back , legs , low abd  ---  She was given a cream, lotrisone and atarax but she did not use it   Rash is very itchy.   She would like to be referred to derm asap. Pt also c/o dark urine with odor.   No d/c.  She is not drinking much water.    Review of Systems  Constitutional: Negative for appetite change, diaphoresis, fatigue and unexpected weight change.  Eyes: Negative for pain, redness and visual disturbance.  Respiratory: Negative for cough, chest tightness, shortness of breath and wheezing.   Cardiovascular: Negative for chest pain, palpitations and leg swelling.  Endocrine: Negative for cold intolerance, heat intolerance, polydipsia, polyphagia and polyuria.  Genitourinary: Negative for difficulty urinating, dysuria and frequency.  Skin: Positive for rash.  Neurological: Negative for dizziness, light-headedness, numbness and headaches.    History Past Medical History:  Diagnosis Date  . Eczema   . MRSA (methicillin resistant Staphylococcus aureus)     She has no past surgical history on file.   Her family history includes Diabetes in her maternal grandmother; Hypertension in her brother and mother; Kidney failure in her brother; Stroke in her mother.She reports that she has been smoking.  She has never used smokeless tobacco. She reports that she drinks alcohol. She reports that she does not use drugs.  Current Outpatient Medications on File Prior to Visit  Medication Sig Dispense Refill  . clotrimazole (LOTRIMIN) 1 % cream Apply to affected area 2 times daily (Patient not taking: Reported on 01/23/2018) 15 g 0  . hydrOXYzine (ATARAX/VISTARIL) 25 MG tablet Take 0.5-1 tablets (12.5-25 mg total) by mouth every 8 (eight) hours as needed for itching. (Patient not taking: Reported  on 01/23/2018) 30 tablet 0   No current facility-administered medications on file prior to visit.      Objective:  Objective  Physical Exam  Constitutional: She is oriented to person, place, and time. She appears well-developed and well-nourished.  HENT:  Head: Normocephalic and atraumatic.  Eyes: Conjunctivae and EOM are normal.  Neck: Normal range of motion. Neck supple. No JVD present. Carotid bruit is not present. No thyromegaly present.  Cardiovascular: Normal rate, regular rhythm and normal heart sounds.  No murmur heard. Pulmonary/Chest: Effort normal and breath sounds normal. No respiratory distress. She has no wheezes. She has no rales. She exhibits no tenderness.  Musculoskeletal: She exhibits no edema.  Neurological: She is alert and oriented to person, place, and time.  Skin: Rash noted. Rash is maculopapular.     Psychiatric: She has a normal mood and affect.  Nursing note and vitals reviewed.  BP (!) 149/101 (BP Location: Left Arm, Patient Position: Sitting, Cuff Size: Normal)   Pulse 79   Temp 97.9 F (36.6 C) (Oral)   Ht 4' 11.5" (1.511 m)   Wt 134 lb (60.8 kg)   SpO2 100%   BMI 26.61 kg/m  Wt Readings from Last 3 Encounters:  01/23/18 134 lb (60.8 kg)  12/13/17 131 lb (59.4 kg)  10/14/13 123 lb 7 oz (56 kg)     Lab Results  Component Value Date   WBC 7.4 01/23/2018   HGB 13.9 01/23/2018   HCT 42.1 01/23/2018   PLT 241.0 01/23/2018  GLUCOSE 81 01/23/2018   CHOL 161 01/23/2018   TRIG 96.0 01/23/2018   HDL 35.40 (L) 01/23/2018   LDLCALC 106 (H) 01/23/2018   ALT 19 01/23/2018   AST 12 01/23/2018   NA 140 01/23/2018   K 5.1 01/23/2018   CL 104 01/23/2018   CREATININE 0.89 01/23/2018   BUN 17 01/23/2018   CO2 30 01/23/2018   TSH 1.83 01/23/2018    No results found.   Assessment & Plan:  Plan  I am having Elizabeth Livingston start on doxycycline, predniSONE, and amLODipine. I am also having her maintain her clotrimazole and  hydrOXYzine.  Meds ordered this encounter  Medications  . doxycycline (VIBRA-TABS) 100 MG tablet    Sig: Take 1 tablet (100 mg total) by mouth 2 (two) times daily.    Dispense:  20 tablet    Refill:  0  . predniSONE (DELTASONE) 10 MG tablet    Sig: TAKE 3 TABLETS PO QD FOR 3 DAYS THEN TAKE 2 TABLETS PO QD FOR 3 DAYS THEN TAKE 1 TABLET PO QD FOR 3 DAYS THEN TAKE 1/2 TAB PO QD FOR 3 DAYS    Dispense:  20 tablet    Refill:  0  . amLODipine (NORVASC) 2.5 MG tablet    Sig: Take 1 tablet (2.5 mg total) by mouth daily.    Dispense:  30 tablet    Refill:  3    Problem List Items Addressed This Visit      Unprioritized   Essential hypertension    Poorly controlled will alter medications, encouraged DASH diet, minimize caffeine and obtain adequate sleep. Report concerning symptoms and follow up as directed and as needed\ meds started today rto 2-3 weeks or sooner prn       Relevant Medications   amLODipine (NORVASC) 2.5 MG tablet   Other Relevant Orders   Lipid panel (Completed)   CBC with Differential/Platelet (Completed)   Comprehensive metabolic panel (Completed)   TSH (Completed)    Other Visit Diagnoses    Rash    -  Primary   Relevant Medications   doxycycline (VIBRA-TABS) 100 MG tablet   predniSONE (DELTASONE) 10 MG tablet   Other Relevant Orders   Ambulatory referral to Dermatology   Bad odor of urine       Relevant Orders   POCT Urinalysis Dipstick (Automated)   Hematuria, unspecified type       Relevant Orders   Urine Culture      Follow-up: Return in about 2 weeks (around 02/06/2018), or if symptoms worsen or fail to improve, for bp check .  Donato Schultz, DO

## 2018-01-24 LAB — POC URINALSYSI DIPSTICK (AUTOMATED)
BILIRUBIN UA: NEGATIVE
Glucose, UA: NEGATIVE
Ketones, UA: NEGATIVE
LEUKOCYTES UA: NEGATIVE
NITRITE UA: NEGATIVE
Protein, UA: POSITIVE — AB
Spec Grav, UA: 1.025 (ref 1.010–1.025)
UROBILINOGEN UA: 0.2 U/dL
pH, UA: 6 (ref 5.0–8.0)

## 2018-01-25 LAB — URINE CULTURE
MICRO NUMBER:: 90864443
SPECIMEN QUALITY:: ADEQUATE

## 2018-01-27 ENCOUNTER — Ambulatory Visit: Payer: Self-pay | Admitting: Family Medicine

## 2018-01-27 MED ORDER — SULFAMETHOXAZOLE-TRIMETHOPRIM 400-80 MG PO TABS: 1.0000 | ORAL_TABLET | Freq: Two times a day (BID) | ORAL | 0 refills | Status: DC

## 2018-01-27 NOTE — Addendum Note (Signed)
Addended by: Crissie SicklesARTER, Jonavan Vanhorn A on: 01/27/2018 11:25 AM   Modules accepted: Orders

## 2018-01-27 NOTE — Telephone Encounter (Signed)
Pt called in with question regarding her antibiotic that I was not able to answer and could not find the conversation she was referring to.     She said she talked with someone from the office but I could not find any documentation.  I connected her with the flow coordinator with [facility] and they are going to assist her.

## 2018-02-01 ENCOUNTER — Ambulatory Visit: Payer: Self-pay | Admitting: *Deleted

## 2018-02-01 NOTE — Telephone Encounter (Signed)
Returning call to patient regarding a rash.  No answer/unable to leave voice mail or message.

## 2018-02-03 ENCOUNTER — Other Ambulatory Visit: Payer: Self-pay | Admitting: Family Medicine

## 2018-02-03 ENCOUNTER — Ambulatory Visit: Payer: Self-pay | Admitting: *Deleted

## 2018-02-03 ENCOUNTER — Ambulatory Visit: Payer: BLUE CROSS/BLUE SHIELD | Admitting: Family Medicine

## 2018-02-03 ENCOUNTER — Ambulatory Visit (INDEPENDENT_AMBULATORY_CARE_PROVIDER_SITE_OTHER): Payer: BLUE CROSS/BLUE SHIELD | Admitting: Family Medicine

## 2018-02-03 VITALS — BP 132/85 | HR 75

## 2018-02-03 DIAGNOSIS — R21 Rash and other nonspecific skin eruption: Secondary | ICD-10-CM

## 2018-02-03 DIAGNOSIS — I10 Essential (primary) hypertension: Secondary | ICD-10-CM

## 2018-02-03 MED ORDER — HYDROXYZINE HCL 25 MG PO TABS: 12.5000 mg | ORAL_TABLET | Freq: Three times a day (TID) | ORAL | 0 refills | Status: DC | PRN

## 2018-02-03 NOTE — Telephone Encounter (Signed)
Please get appointment sooner somewhere else --- see pt phone note

## 2018-02-03 NOTE — Telephone Encounter (Signed)
Per Dedra SkeensGwen, referral coordinator, pt. has [facility] app[facility]. Author phoned pt. to review, VM full, unable to leave message.

## 2018-02-03 NOTE — Progress Notes (Signed)
Reviewed  Yvonne R Lowne Chase, DO  

## 2018-02-03 NOTE — Progress Notes (Signed)
Pre visit review using our clinic tool,if applicable. No additional management support is needed unless otherwise documented below in the visit note.   Pt here for Blood pressure check per order from Dr. Zola ButtonLowne-Chase dated 01/23/18. Patient had appointment with provider today but arrived too late.  Pt currently takes: Amlodipine 25 mg daily for High Blood Pressure management.  Pt reports compliance with medication. Patient complains of Rash on her leg. Checked with referral coordinator advised that she had to switch Dermatologists but patient should hear from the soon regarding an appointment. Refill submitted on patients Hydroxzine 25 mg for itching per ok from Dr. Zola ButtonLowne-Chase.  BP today  = 132/85 P = 75  Pt advised per to continue BP medication as ordered. Schedule for follow up appointment. Patient agreed.

## 2018-02-03 NOTE — Telephone Encounter (Signed)
Pt called regarding rash, she states that she is still itching and the rash is still there";the pt also says "it looks different in different places" she also says that she is finished with the prednisone and is still taking vibramycin; the pt is also upset that she is not scheduled to see dermatology until 06/29/18; she states that "this is unacceptable, and she needs to see a dermatologist sooner"; recommendations made per nurse triage to include seeing the physician within 24 hours; pt offered and accepted appointment with Dr Zola ButtonLowne-Chase today at 1000; she verbalizes understanding; will route to office for notification of this upcoming appointment; also [facility] with [facility]ily[facility], regarding this encounter.    Reason for Disposition . Hives or itching . SEVERE itching (i.e., interferes with sleep, normal activities or school)  Answer Assessment - Initial Assessment Questions 1. APPEARANCE of RASH: "Describe the rash." (e.g., spots, blisters, raised areas, skin peeling, scaly)     Spots are dry, red ,patches; looks different in different palces 2. SIZE: "How big are the spots?" (e.g., tip of pen, eraser, coin; inches, centimeters)     Unable to access 3. LOCATION: "Where is the rash located?"     All over her body 4. COLOR: "What color is the rash?" (Note: It is difficult to assess rash color in people with darker-colored skin. When this situation occurs, simply ask the caller to describe what they see.)     Spots are dry, red ,patches; looks different in different palces 5. ONSET: "When did the rash begin?"  seen in office 01/23/18 6. FEVER: "Do you have a fever?" If so, ask: "What is your temperature, how was it measured, and when did it start?"     no 7. ITCHING: "Does the rash itch?" If so, ask: "How bad is the itch?" (Scale 1-10; or mild, moderate, severe)     moderate 8. CAUSE: "What do you think is causing the rash?"     unsure 9. NEW MEDICATION: "What new medication are you  taking?" (e.g., name of antibiotic) "When did you start taking this medication?".     Doxycycline 100 mg twice daily starting 01/23/18 and Bactrim 400/80 1 tablet twice daily starting 01/27/18 10. OTHER SYMPTOMS: "Do you have any other symptoms?" (e.g., sore throat, fever, joint pain)       none 11. PREGNANCY: "Is there any chance you are pregnant?" "When was your last menstrual period?"       no  Answer Assessment - Initial Assessment Questions 1. APPEARANCE of RASH: "Describe the rash." (e.g., spots, blisters, raised areas, skin peeling, scaly)     Spots are dry, red ,patches; looks different in different palces 2. SIZE: "How big are the spots?" (e.g., tip of pen, eraser, coin; inches, centimeters)     Unable to access 3. LOCATION: "Where is the rash located?"     All over her body 4. COLOR: "What color is the rash?" (Note: It is difficult to assess rash color in people with darker-colored skin. When this situation occurs, simply ask the caller to describe what they see.)     Spots are dry, red ,patches; looks different in different palces 5. ONSET: "When did the rash begin?"  seen in office 01/23/18 6. FEVER: "Do you have a fever?" If so, ask: "What is your temperature, how was it measured, and when did it start?"     no 7. ITCHING: "Does the rash itch?" If so, ask: "How bad is the itch?" (Scale 1-10; or mild,  moderate, severe)     moderate 8. CAUSE: "What do you think is causing the rash?"     unsure 9. NEW MEDICATION: "What new medication are you taking?" (e.g., name of antibiotic) "When did you start taking this medication?".     Doxycycline 100 mg twice daily starting 01/23/18 and Bactrim 400/80 1 tablet twice daily starting 01/27/18 10. OTHER SYMPTOMS: "Do you have any other symptoms?" (e.g., sore throat, fever, joint pain)       none 11. PREGNANCY: "Is there any chance you are pregnant?" "When was your last menstrual period?"       no  Protocols used: RASH - WIDESPREAD ON  [id number], RASH OR REDNESS - [facility]

## 2018-02-03 NOTE — Telephone Encounter (Signed)
Pt. Calling re: unimproved rash, and is upset because her dermatology appointment is not until December. Pt is requesting to be referred to a different dermatology office and/or requesting dermatology consult be changed to urgent status. Pt. Is scheduled for an OV with Dr. Laury AxonLowne today at 10AM. Dr. Laury AxonLowne and CMA, Festus Barrenanesha, made aware.

## 2018-02-06 ENCOUNTER — Ambulatory Visit: Payer: BLUE CROSS/BLUE SHIELD

## 2018-02-09 ENCOUNTER — Telehealth: Payer: Self-pay

## 2018-02-09 ENCOUNTER — Telehealth: Payer: Self-pay | Admitting: *Deleted

## 2018-02-09 DIAGNOSIS — R21 Rash and other nonspecific skin eruption: Secondary | ICD-10-CM

## 2018-02-09 NOTE — Telephone Encounter (Signed)
Pt. called, stating that her rash has not improved since the bactrim was prescribed. She finished the entire course. Pt. stated [facility] but she keeps missing them, and would like the office to call. Pt. also requesting [facility] to [facility] [place], [place], but pt. unsure which street it is on, so she will call author back with that information tomorrow pt. States. Hydroxyzine was originally ordered 8/2 and sent [facility] local [facility], but it was a print Rx, and the pharmacy denies having received a faxed rx per pt. Author phoned [facility] to make an appointment for pt. Appointment [facility] for [facility] Monday, September 9th for 115PM, which was the earliest appointment they had for urgent referrals. Practice is at [address], [place], [place] [id number]. If pt. needs to call back, she can call #[phone] and [facility] for [facility].  Author will call pt. tomorrow to relay appointment date and retrieve appropriate pharmacy for hydroxyzine rx.

## 2018-02-09 NOTE — Telephone Encounter (Signed)
Copied from CRM [id number]. Topic: Inquiry >> Feb 09, 2018 12:49 PM Windy KalataMichael, Taylor L, NT wrote: Reason for CRM: patient is calling and is needing a refill on a medication but is unsure of the name of the medication. She states it was for a rash. She is requesting a call back from Dr. Laury AxonLowne nurse.

## 2018-02-09 NOTE — Telephone Encounter (Signed)
[facility]

## 2018-02-10 MED ORDER — HYDROXYZINE HCL 25 MG PO TABS
12.5000 mg | ORAL_TABLET | Freq: Three times a day (TID) | ORAL | 0 refills | Status: AC | PRN
Start: 2018-02-10 — End: ?

## 2018-02-10 NOTE — Telephone Encounter (Signed)
Pt. phoned author back, and relayed the pharmacy address to which she would like her hydroxyzine sent. [facility]oned [facility] in [place] to confirm that they never received the rx per pt. Report, which they did confirm. Author told pt. About 9/9 appointment with [facility] at 115PM, and appointment with Dr. Laury [facility]Lowne at [facility] the same day. Pt appreciative of help, no other concerns at this time.

## 2018-02-28 ENCOUNTER — Telehealth: Payer: Self-pay | Admitting: Family Medicine

## 2018-02-28 DIAGNOSIS — I10 Essential (primary) hypertension: Secondary | ICD-10-CM

## 2018-02-28 MED ORDER — AMLODIPINE BESYLATE 2.5 MG PO TABS: 2.5000 mg | ORAL_TABLET | Freq: Every day | ORAL | 2 refills | Status: DC

## 2018-02-28 NOTE — Telephone Encounter (Signed)
Copied from CRM [id number]. Topic: Quick Communication - Rx Refill/Question >> Feb 28, 2018 11:43 AM Oneal GroutSebastian, Jennifer S wrote: Medication: amLODipine (NORVASC) 2.5 MG tablet, only has 1 pill left  Has the patient contacted their pharmacy? Yes.  New pharmacy (Agent: If no, request that the patient contact the pharmacy for the refill.) (Agent: If yes, when and what did the pharmacy advise?)  Preferred Pharmacy (with phone number or street name):[facility] [place], [place] - [address]  Agent: Please be advised that RX refills may take up to 3 business days. We ask that you follow-up with your pharmacy.

## 2018-02-28 NOTE — Telephone Encounter (Signed)
Request for Amlodipine Rx to be sent to a different pharmacy.   LRF 01/23/18; #30; RF x 3 LOV 02/03/18 Pharm[facility] [address], [place], [place]    Refill sent through to new pharm. per pt. Request.

## 2018-03-13 ENCOUNTER — Ambulatory Visit: Payer: BLUE CROSS/BLUE SHIELD | Admitting: Family Medicine

## 2018-03-14 ENCOUNTER — Ambulatory Visit: Payer: BLUE CROSS/BLUE SHIELD | Admitting: Family Medicine

## 2018-03-14 ENCOUNTER — Other Ambulatory Visit (HOSPITAL_COMMUNITY)
Admission: RE | Admit: 2018-03-14 | Discharge: 2018-03-14 | Disposition: A | Discharge status: Home / Self Care | Payer: BLUE CROSS/BLUE SHIELD | Source: Ambulatory Visit | Attending: Family Medicine | Admitting: Family Medicine

## 2018-03-14 ENCOUNTER — Encounter: Payer: Self-pay | Admitting: Family Medicine

## 2018-03-14 VITALS — BP 136/79 | HR 88 | Temp 97.7°F | Resp 16 | Ht 60.0 in | Wt 136.2 lb

## 2018-03-14 DIAGNOSIS — N898 Other specified noninflammatory disorders of vagina: Secondary | ICD-10-CM | POA: Insufficient documentation

## 2018-03-14 DIAGNOSIS — Z87440 Personal history of urinary (tract) infections: Secondary | ICD-10-CM | POA: Diagnosis not present

## 2018-03-14 LAB — POC URINALSYSI DIPSTICK (AUTOMATED)
BILIRUBIN UA: NEGATIVE
Glucose, UA: NEGATIVE
Ketones, UA: NEGATIVE
Leukocytes, UA: NEGATIVE
NITRITE UA: NEGATIVE
Protein, UA: NEGATIVE
RBC UA: NEGATIVE
Urobilinogen, UA: 0.2 E.U./dL
pH, UA: 6 (ref 5.0–8.0)

## 2018-03-14 MED ORDER — FLUCONAZOLE 150 MG PO TABS: 150.0000 mg | ORAL_TABLET | Freq: Once | ORAL | 1 refills | Status: AC

## 2018-03-14 NOTE — Patient Instructions (Signed)

## 2018-03-14 NOTE — Progress Notes (Signed)
Patient ID: Elizabeth Livingston, female    DOB: 1965-08-23  Age: 52 y.o. MRN: [id number]    Subjective:  Subjective  HPI Elizabeth Livingston presents for vaginal itching , with white d/c and odor.  No abd pain.  Pt would like std testing but will return for blood work   Review of Systems  Constitutional: Negative for chills and fever.  HENT: Negative for congestion and hearing loss.   Eyes: Negative for discharge.  Respiratory: Negative for cough and shortness of breath.   Cardiovascular: Negative for chest pain, palpitations and leg swelling.  Gastrointestinal: Negative for abdominal pain, blood in stool, constipation, diarrhea, nausea and vomiting.  Genitourinary: Positive for vaginal discharge. Negative for dysuria, frequency, hematuria and urgency.  Musculoskeletal: Negative for back pain and myalgias.  Skin: Negative for rash.  Allergic/Immunologic: Negative for environmental allergies.  Neurological: Negative for dizziness, weakness and headaches.  Hematological: Does not bruise/bleed easily.  Psychiatric/Behavioral: Negative for suicidal ideas. The patient is not nervous/anxious.     History Past Medical History:  Diagnosis Date  . Eczema   . MRSA (methicillin resistant Staphylococcus aureus)     She has no past surgical history on file.   Her family history includes Diabetes in her maternal grandmother; Hypertension in her brother and mother; Kidney failure in her brother; Stroke in her mother.She reports that she has been smoking. She has never used smokeless tobacco. She reports that she drinks alcohol. She reports that she does not use drugs.  Current Outpatient Medications on File Prior to Visit  Medication Sig Dispense Refill  . amLODipine (NORVASC) 2.5 MG tablet Take 1 tablet (2.5 mg total) by mouth daily. 30 tablet 2  . clotrimazole (LOTRIMIN) 1 % cream Apply to affected area 2 times daily 15 g 0  . hydrOXYzine (ATARAX/VISTARIL) 25 MG tablet Take 0.5-1 tablets (12.5-25  mg total) by mouth every 8 (eight) hours as needed for itching. 30 tablet 0   No current facility-administered medications on file prior to visit.      Objective:  Objective  Physical Exam  Constitutional: She is oriented to person, place, and time. She appears well-developed and well-nourished.  HENT:  Head: Normocephalic and atraumatic.  Eyes: Conjunctivae and EOM are normal.  Neck: Normal range of motion. Neck supple. No JVD present. Carotid bruit is not present. No thyromegaly present.  Cardiovascular: Normal rate, regular rhythm and normal heart sounds.  No murmur heard. Pulmonary/Chest: Effort normal and breath sounds normal. No respiratory distress. She has no wheezes. She has no rales. She exhibits no tenderness.  Abdominal: Soft. She exhibits no distension. There is no tenderness. There is no rebound, no guarding and no CVA tenderness.  Genitourinary: Pelvic exam was performed with patient supine. There is no rash, tenderness, lesion or injury on the right labia. There is no rash, tenderness, lesion or injury on the left labia. Cervix exhibits discharge. There is tenderness in the vagina. No erythema in the vagina. Vaginal discharge found.  Musculoskeletal: She exhibits no edema.  Neurological: She is alert and oriented to person, place, and time.  Skin:     Psychiatric: She has a normal mood and affect.  Nursing note and vitals reviewed.  BP 136/79 (BP Location: Right Arm, Cuff Size: Normal)   Pulse 88   Temp 97.7 F (36.5 C) (Oral)   Resp 16   Ht 5' (1.524 m)   Wt 136 lb 3.2 oz (61.8 kg)   SpO2 100%   BMI 26.60 kg/m  Wt Readings from Last 3 Encounters:  03/14/18 136 lb 3.2 oz (61.8 kg)  01/23/18 134 lb (60.8 kg)  12/13/17 131 lb (59.4 kg)     Lab Results  Component Value Date   WBC 7.4 01/23/2018   HGB 13.9 01/23/2018   HCT 42.1 01/23/2018   PLT 241.0 01/23/2018   GLUCOSE 81 01/23/2018   CHOL 161 01/23/2018   TRIG 96.0 01/23/2018   HDL 35.40 (L)  01/23/2018   LDLCALC 106 (H) 01/23/2018   ALT 19 01/23/2018   AST 12 01/23/2018   NA 140 01/23/2018   K 5.1 01/23/2018   CL 104 01/23/2018   CREATININE 0.89 01/23/2018   BUN 17 01/23/2018   CO2 30 01/23/2018   TSH 1.83 01/23/2018    No results found.   Assessment & Plan:  Plan  I have discontinued Elizabeth Livingston doxycycline, predniSONE, and sulfamethoxazole-trimethoprim. I am also having her start on fluconazole. Additionally, I am having her maintain her clotrimazole, hydrOXYzine, and amLODipine.  Meds ordered this encounter  Medications  . fluconazole (DIFLUCAN) 150 MG tablet    Sig: Take 1 tablet (150 mg total) by mouth once for 1 dose. May repeat in 3 days prn    Dispense:  2 tablet    Refill:  1    Problem List Items Addressed This Visit    None    Visit Diagnoses    Vaginal itching    -  Primary   Relevant Medications   fluconazole (DIFLUCAN) 150 MG tablet   Other Relevant Orders   Cervicovaginal ancillary only   History of UTI       Relevant Medications   fluconazole (DIFLUCAN) 150 MG tablet   Other Relevant Orders   POCT Urinalysis Dipstick (Automated) (Completed)       UA normal  Follow-up: Return if symptoms worsen or fail to improve.  Donato Schultz, DO

## 2018-03-16 ENCOUNTER — Other Ambulatory Visit: Payer: Self-pay | Admitting: Family Medicine

## 2018-03-16 DIAGNOSIS — B9689 Other specified bacterial agents as the cause of diseases classified elsewhere: Secondary | ICD-10-CM

## 2018-03-16 DIAGNOSIS — N760 Acute vaginitis: Secondary | ICD-10-CM

## 2018-03-16 LAB — CERVICOVAGINAL ANCILLARY ONLY
Bacterial vaginitis: POSITIVE — AB
CANDIDA VAGINITIS: POSITIVE — AB
CHLAMYDIA, DNA PROBE: NEGATIVE
NEISSERIA GONORRHEA: NEGATIVE
Trichomonas: NEGATIVE

## 2018-03-16 MED ORDER — FLUCONAZOLE 150 MG PO TABS: 150.0000 mg | ORAL_TABLET | Freq: Once | ORAL | 0 refills | Status: AC

## 2018-03-16 MED ORDER — METRONIDAZOLE 500 MG PO TABS: 500.0000 mg | ORAL_TABLET | Freq: Two times a day (BID) | ORAL | 0 refills | Status: DC

## 2018-03-20 ENCOUNTER — Other Ambulatory Visit: Payer: Self-pay | Admitting: Family Medicine

## 2018-03-20 ENCOUNTER — Telehealth: Payer: Self-pay | Admitting: Family Medicine

## 2018-03-20 DIAGNOSIS — B9689 Other specified bacterial agents as the cause of diseases classified elsewhere: Secondary | ICD-10-CM

## 2018-03-20 DIAGNOSIS — N760 Acute vaginitis: Principal | ICD-10-CM

## 2018-03-20 MED ORDER — METRONIDAZOLE 0.75 % VA GEL: 1.0000 | Freq: Every day | VAGINAL | 0 refills | Status: DC

## 2018-03-20 NOTE — Telephone Encounter (Signed)
Copied from CRM #[id number]. [id number]: Quick Communication - See Telephone Encounter >> Mar 20, 2018  2:06 PM Elizabeth KalataMichael, Elizabeth Livingston, NT wrote: CRM for notification. See Telephone encounter for: 03/20/18.  Patient is calling and states on 03/14/18 she was seen for a yeast infection but on 03/16/18 and antibiotic was called in for metroNIDAZOLE (FLAGYL) 500 MG tablet. She has not received her results yet and would like a call back today as she works the rest of the week and is concerned about her test.

## 2018-03-20 NOTE — Telephone Encounter (Signed)
changed

## 2018-03-20 NOTE — Telephone Encounter (Signed)
Patient would like the cream sent in instead of taking the pills for bv.

## 2018-03-21 NOTE — Telephone Encounter (Signed)
Left detailed message on machine that cream was sent in.

## 2018-03-22 MED ORDER — FLUCONAZOLE 150 MG PO TABS: ORAL_TABLET | ORAL | 0 refills | Status: DC

## 2018-03-22 NOTE — Addendum Note (Signed)
Addended by: Crissie SicklesARTER, Jahziel Sinn A on: 03/22/2018 07:54 AM   Modules accepted: Orders

## 2018-04-05 ENCOUNTER — Ambulatory Visit: Payer: BLUE CROSS/BLUE SHIELD | Admitting: Medical

## 2018-04-10 ENCOUNTER — Other Ambulatory Visit (HOSPITAL_COMMUNITY)
Admission: RE | Admit: 2018-04-10 | Discharge: 2018-04-10 | Disposition: A | Discharge status: Home / Self Care | Payer: BLUE CROSS/BLUE SHIELD | Source: Ambulatory Visit | Attending: Family Medicine | Admitting: Family Medicine

## 2018-04-10 ENCOUNTER — Ambulatory Visit (INDEPENDENT_AMBULATORY_CARE_PROVIDER_SITE_OTHER): Payer: BLUE CROSS/BLUE SHIELD | Admitting: Family Medicine

## 2018-04-10 ENCOUNTER — Encounter: Payer: Self-pay | Admitting: Family Medicine

## 2018-04-10 VITALS — BP 129/72 | HR 84 | Temp 97.9°F | Resp 16 | Ht 60.0 in | Wt 134.8 lb

## 2018-04-10 DIAGNOSIS — N760 Acute vaginitis: Secondary | ICD-10-CM | POA: Insufficient documentation

## 2018-04-10 DIAGNOSIS — Z1239 Encounter for other screening for malignant neoplasm of breast: Secondary | ICD-10-CM | POA: Diagnosis not present

## 2018-04-10 DIAGNOSIS — Z0000 Encounter for general adult medical examination without abnormal findings: Secondary | ICD-10-CM | POA: Insufficient documentation

## 2018-04-10 DIAGNOSIS — R21 Rash and other nonspecific skin eruption: Secondary | ICD-10-CM

## 2018-04-10 DIAGNOSIS — R200 Anesthesia of skin: Secondary | ICD-10-CM

## 2018-04-10 DIAGNOSIS — Z1211 Encounter for screening for malignant neoplasm of colon: Secondary | ICD-10-CM

## 2018-04-10 DIAGNOSIS — L439 Lichen planus, unspecified: Secondary | ICD-10-CM | POA: Diagnosis not present

## 2018-04-10 DIAGNOSIS — I10 Essential (primary) hypertension: Secondary | ICD-10-CM

## 2018-04-10 DIAGNOSIS — H547 Unspecified visual loss: Secondary | ICD-10-CM

## 2018-04-10 DIAGNOSIS — R202 Paresthesia of skin: Secondary | ICD-10-CM | POA: Diagnosis not present

## 2018-04-10 MED ORDER — AMLODIPINE BESYLATE 2.5 MG PO TABS: 2.5000 mg | ORAL_TABLET | Freq: Every day | ORAL | 1 refills | Status: AC

## 2018-04-10 NOTE — Progress Notes (Signed)
Subjective:     Elizabeth Livingston is a 52 y.o. female and is here for a comprehensive physical exam. The patient reports problems - pt dx with lichen planus .  She is getting worse and would like a second opinion  She also c/o numbness hands and feet .     Social History   Socioeconomic History  . Marital status: Single    Spouse name: Not on file  . Number of children: Not on file  . Years of education: Not on file  . Highest education level: Not on file  Occupational History  . Not on file  Social Needs  . Financial resource strain: Not on file  . Food insecurity:    Worry: Not on file    Inability: Not on file  . Transportation needs:    Medical: Not on file    Non-medical: Not on file  Tobacco Use  . Smoking status: Current Every Day Smoker  . Smokeless tobacco: Never Used  Substance and Sexual Activity  . Alcohol use: Yes    Comment: occ  . Drug use: No  . Sexual activity: Not on file  Lifestyle  . Physical activity:    Days per week: Not on file    Minutes per session: Not on file  . Stress: Not on file  Relationships  . Social connections:    Talks on phone: Not on file    Gets together: Not on file    Attends religious service: Not on file    Active member of club or organization: Not on file    Attends meetings of clubs or organizations: Not on file    Relationship status: Not on file  . Intimate partner violence:    Fear of current or ex partner: Not on file    Emotionally abused: Not on file    Physically abused: Not on file    Forced sexual activity: Not on file  Other Topics Concern  . Not on file  Social History Narrative  . Not on file   Health Maintenance  Topic Date Due  . TETANUS/TDAP  03/14/1985  . PAP SMEAR  03/15/1987  . MAMMOGRAM  07/06/2008  . COLONOSCOPY  03/14/2016  . INFLUENZA VACCINE  02/02/2018  . HIV Screening  04/11/2019 (Originally 03/14/1981)    The following portions of the patient's history were reviewed and updated as  appropriate:  She  has a past medical history of Eczema and MRSA (methicillin resistant Staphylococcus aureus). She does not have any pertinent problems on file. She  has no past surgical history on file. Her family history includes Diabetes in her maternal grandmother; Hypertension in her brother and mother; Kidney failure in her brother; Lupus in her maternal aunt; Stroke in her mother. She  reports that she has been smoking. She has never used smokeless tobacco. She reports that she drinks alcohol. She reports that she does not use drugs. She has a current medication list which includes the following prescription(s): amlodipine, clotrimazole, fluocinonide cream, and hydroxyzine. Current Outpatient Medications on File Prior to Visit  Medication Sig Dispense Refill  . amLODipine (NORVASC) 2.5 MG tablet Take 1 tablet (2.5 mg total) by mouth daily. 30 tablet 2  . clotrimazole (LOTRIMIN) 1 % cream Apply to affected area 2 times daily 15 g 0  . fluocinonide cream (LIDEX) 0.05 % APP EXT AA BID  1  . hydrOXYzine (ATARAX/VISTARIL) 25 MG tablet Take 0.5-1 tablets (12.5-25 mg total) by mouth every 8 (eight)  hours as needed for itching. 30 tablet 0   No current facility-administered medications on file prior to visit.    She has No Known Allergies..  Review of Systems Review of Systems  Constitutional: Negative for activity change, appetite change and fatigue.  HENT: Negative for hearing loss, congestion, tinnitus and ear discharge.  dentist q70m Eyes: Negative for visual disturbance (see optho q1y -- vision corrected to 20/20 with glasses).  Respiratory: Negative for cough, chest tightness and shortness of breath.   Cardiovascular: Negative for chest pain, palpitations and leg swelling.  Gastrointestinal: Negative for abdominal pain, diarrhea, constipation and abdominal distention.  Genitourinary: Negative for urgency, frequency, decreased urine volume and difficulty urinating.  Musculoskeletal:  Negative for back pain, arthralgias and gait problem.  Skin: Negative for color change, pallor and rash.  Neurological: Negative for dizziness, light-headedness, numbness and headaches.  Hematological: Negative for adenopathy. Does not bruise/bleed easily.  Psychiatric/Behavioral: Negative for suicidal ideas, confusion, sleep disturbance, self-injury, dysphoric mood, decreased concentration and agitation.       Objective:    BP 129/72 (BP Location: Right Arm, Cuff Size: Normal)   Pulse 84   Temp 97.9 F (36.6 C) (Oral)   Resp 16   Ht 5' (1.524 m)   Wt 134 lb 12.8 oz (61.1 kg)   SpO2 100%   BMI 26.33 kg/m  General appearance: alert, cooperative, appears stated age and no distress Head: Normocephalic, without obvious abnormality, atraumatic Eyes: conjunctivae/corneas clear. PERRL, EOM's intact. Fundi benign. Ears: normal TM's and external ear canals both ears Nose: Nares normal. Septum midline. Mucosa normal. No drainage or sinus tenderness. Throat: lips, mucosa, and tongue normal; teeth and gums normal Neck: no adenopathy, no carotid bruit, no JVD, supple, symmetrical, trachea midline and thyroid not enlarged, symmetric, no tenderness/mass/nodules Back: symmetric, no curvature. ROM normal. No CVA tenderness. Lungs: clear to auscultation bilaterally Breasts: normal appearance, no masses or tenderness Heart: regular rate and rhythm, S1, S2 normal, no murmur, click, rub or gallop Abdomen: soft, non-tender; bowel sounds normal; no masses,  no organomegaly Pelvic: cervix normal in appearance, external genitalia normal, no adnexal masses or tenderness, no cervical motion tenderness, rectovaginal septum normal, uterus normal size, shape, and consistency, vagina normal without discharge and pap done, rectal heme neg brown stool Extremities: extremities normal, atraumatic, no cyanosis or edema Pulses: 2+ and symmetric Skin: Skin color, texture, turgor normal. No rashes or lesions Lymph  nodes: Cervical, supraclavicular, and axillary nodes normal. Neurologic: Alert and oriented X 3, normal strength and tone. Normal symmetric reflexes. Normal coordination and gait    Assessment:    Healthy female exam.      Plan:    ghm utd Check labs See After Visit Summary for Counseling Recommendations    1. Rash con't --- pt requesting second opinion - Ambulatory referral to Dermatology - Antinuclear Antib (ANA) - Sedimentation rate  2. Lichen planus  - Ambulatory referral to Dermatology  3. Preventative health care ghm utd Check labs See AVS - Ambulatory referral to Gastroenterology - Cytology - PAP  4. Breast cancer screening   - MM 3D SCREEN BREAST BILATERAL; Future  5. Poor vision  - Ambulatory referral to Ophthalmology  6. Essential hypertension Well controlled, no changes to meds. Encouraged heart healthy diet such as the DASH diet and exercise as tolerated.   - amLODipine (NORVASC) 2.5 MG tablet; Take 1 tablet (2.5 mg total) by mouth daily.  Dispense: 90 tablet; Refill: 1  7. Numbness and tingling in both hands  -  Ambulatory referral to Neurology - Vitamin B12  8. Numbness and tingling of both feet   - Ambulatory referral to Neurology - Vitamin B12

## 2018-04-10 NOTE — Patient Instructions (Signed)
Preventive Care 40-64 Years, Female Preventive care refers to lifestyle choices and visits with your health care provider that can promote health and wellness. What does preventive care include?  A yearly physical exam. This is also called an annual well check.  Dental exams once or twice a year.  Routine eye exams. Ask your health care provider how often you should have your eyes checked.  Personal lifestyle choices, including: ? Daily care of your teeth and gums. ? Regular physical activity. ? Eating a healthy diet. ? Avoiding tobacco and drug use. ? Limiting alcohol use. ? Practicing safe sex. ? Taking low-dose aspirin daily starting at age 58. ? Taking vitamin and mineral supplements as recommended by your health care provider. What happens during an annual well check? The services and screenings done by your health care provider during your annual well check will depend on your age, overall health, lifestyle risk factors, and family history of disease. Counseling Your health care provider may ask you questions about your:  Alcohol use.  Tobacco use.  Drug use.  Emotional well-being.  Home and relationship well-being.  Sexual activity.  Eating habits.  Work and work Statistician.  Method of birth control.  Menstrual cycle.  Pregnancy history.  Screening You may have the following tests or measurements:  Height, weight, and BMI.  Blood pressure.  Lipid and cholesterol levels. These may be checked every 5 years, or more frequently if you are over 81 years old.  Skin check.  Lung cancer screening. You may have this screening every year starting at age 78 if you have a 30-pack-year history of smoking and currently smoke or have quit within the past 15 years.  Fecal occult blood test (FOBT) of the stool. You may have this test every year starting at age 65.  Flexible sigmoidoscopy or colonoscopy. You may have a sigmoidoscopy every 5 years or a colonoscopy  every 10 years starting at age 30.  Hepatitis C blood test.  Hepatitis B blood test.  Sexually transmitted disease (STD) testing.  Diabetes screening. This is done by checking your blood sugar (glucose) after you have not eaten for a while (fasting). You may have this done every 1-3 years.  Mammogram. This may be done every 1-2 years. Talk to your health care provider about when you should start having regular mammograms. This may depend on whether you have a family history of breast cancer.  BRCA-related cancer screening. This may be done if you have a family history of breast, ovarian, tubal, or peritoneal cancers.  Pelvic exam and Pap test. This may be done every 3 years starting at age 80. Starting at age 36, this may be done every 5 years if you have a Pap test in combination with an HPV test.  Bone density scan. This is done to screen for osteoporosis. You may have this scan if you are at high risk for osteoporosis.  Discuss your test results, treatment options, and if necessary, the need for more tests with your health care provider. Vaccines Your health care provider may recommend certain vaccines, such as:  Influenza vaccine. This is recommended every year.  Tetanus, diphtheria, and acellular pertussis (Tdap, Td) vaccine. You may need a Td booster every 10 years.  Varicella vaccine. You may need this if you have not been vaccinated.  Zoster vaccine. You may need this after age 5.  Measles, mumps, and rubella (MMR) vaccine. You may need at least one dose of MMR if you were born in  1957 or later. You may also need a second dose.  Pneumococcal 13-valent conjugate (PCV13) vaccine. You may need this if you have certain conditions and were not previously vaccinated.  Pneumococcal polysaccharide (PPSV23) vaccine. You may need one or two doses if you smoke cigarettes or if you have certain conditions.  Meningococcal vaccine. You may need this if you have certain  conditions.  Hepatitis A vaccine. You may need this if you have certain conditions or if you travel or work in places where you may be exposed to hepatitis A.  Hepatitis B vaccine. You may need this if you have certain conditions or if you travel or work in places where you may be exposed to hepatitis B.  Haemophilus influenzae type b (Hib) vaccine. You may need this if you have certain conditions.  Talk to your health care provider about which screenings and vaccines you need and how often you need them. This information is not intended to replace advice given to you by your health care provider. Make sure you discuss any questions you have with your health care provider. Document Released: 07/18/2015 Document Revised: 03/10/2016 Document Reviewed: 04/22/2015 Elsevier Interactive Patient Education  [employer].

## 2018-04-12 LAB — CYTOLOGY - PAP
Bacterial vaginitis: POSITIVE — AB
CANDIDA VAGINITIS: NEGATIVE
CHLAMYDIA, DNA PROBE: NEGATIVE
DIAGNOSIS: NEGATIVE
HPV: NOT DETECTED
Neisseria Gonorrhea: NEGATIVE
Trichomonas: NEGATIVE

## 2018-04-12 LAB — POC HEMOCCULT BLD/STL (OFFICE/1-CARD/DIAGNOSTIC)
Card #1 Date: 10072019
Fecal Occult Blood, POC: NEGATIVE

## 2018-04-12 NOTE — Addendum Note (Signed)
Addended by: Thelma Barge D on: 04/12/2018 09:02 AM   Modules accepted: Orders

## 2018-04-14 ENCOUNTER — Other Ambulatory Visit: Payer: Self-pay | Admitting: *Deleted

## 2018-04-14 DIAGNOSIS — N760 Acute vaginitis: Principal | ICD-10-CM

## 2018-04-14 DIAGNOSIS — B9689 Other specified bacterial agents as the cause of diseases classified elsewhere: Secondary | ICD-10-CM

## 2018-04-14 MED ORDER — METRONIDAZOLE 0.75 % VA GEL: 1.0000 | Freq: Every day | VAGINAL | 0 refills | Status: AC

## 2018-04-15 ENCOUNTER — Ambulatory Visit (HOSPITAL_BASED_OUTPATIENT_CLINIC_OR_DEPARTMENT_OTHER): Payer: BLUE CROSS/BLUE SHIELD

## 2018-04-17 ENCOUNTER — Other Ambulatory Visit: Payer: BLUE CROSS/BLUE SHIELD

## 2018-04-18 ENCOUNTER — Encounter: Payer: Self-pay | Admitting: Neurology

## 2018-07-03 ENCOUNTER — Ambulatory Visit: Payer: BLUE CROSS/BLUE SHIELD | Admitting: Advanced Practice Midwife

## 2018-07-16 NOTE — Progress Notes (Deleted)
[facility] Clinic Note - Initial Visit   Date: 07/16/18  Elizabeth Livingston MRN: [id number] DOB: 05/13/1966   Dear Dr. Zola ButtonLowne Chase:   Thank you for your kind referral of Elizabeth Livingston for consultation of paresthesias of the hands and feet. Although her history is well known to you, please allow us to reiterate it for the purpose of our medical record. The patient was accompanied to the clinic by *** who also provides collateral information.     History of Present Illness: Elizabeth Livingston is a 53 y.o. ***-handed Caucasian/*** female with hypertension and eczema presenting for evaluation of numbness and tingling of the hands and feet..    She started experiencing numbness of the hands and feet ***.  Symptoms are *** and improved by ***.   She denies any weakness, back pain, neck pain, or falls.  Out-side paper records, electronic medical record, and images have been reviewed where available and summarized as: *** Lab Results  Component Value Date   TSH 1.83 01/23/2018    Past Medical History:  Diagnosis Date  . Eczema   . MRSA (methicillin resistant Staphylococcus aureus)     No past surgical history on file.   Medications:  Outpatient Encounter Medications as of 07/17/2018  Medication Sig  . amLODipine (NORVASC) 2.5 MG tablet Take 1 tablet (2.5 mg total) by mouth daily.  . clotrimazole (LOTRIMIN) 1 % cream Apply to affected area 2 times daily  . fluocinonide cream (LIDEX) 0.05 % APP EXT AA BID  . hydrOXYzine (ATARAX/VISTARIL) 25 MG tablet Take 0.5-1 tablets (12.5-25 mg total) by mouth every 8 (eight) hours as needed for itching.   No facility-administered encounter medications on file as of 07/17/2018.      Allergies: No Known Allergies  Family History: Family History  Problem Relation Age of Onset  . Hypertension Mother   . Stroke Mother   . Hypertension Brother   . Kidney failure Brother   . Diabetes Maternal Grandmother   .  Lupus Maternal Aunt     Social History: Social History   Tobacco Use  . Smoking status: Current Every Day Smoker  . Smokeless tobacco: Never Used  Substance Use Topics  . Alcohol use: Yes    Comment: occ  . Drug use: No   Social History   Social History Narrative  . Not on file    Review of Systems:  CONSTITUTIONAL: No fevers, chills, night sweats, or weight loss.  *** EYES: No visual changes or eye pain ENT: No hearing changes.  No history of nose bleeds.   RESPIRATORY: No cough, wheezing and shortness of breath.   CARDIOVASCULAR: Negative for chest pain, and palpitations.   GI: Negative for abdominal discomfort, blood in stools or black stools.  No recent change in bowel habits.   GU:  No history of incontinence.   MUSCLOSKELETAL: No history of joint pain or swelling.  No myalgias.   SKIN: Negative for lesions, rash, and itching.   HEMATOLOGY/ONCOLOGY: Negative for prolonged bleeding, bruising easily, and swollen nodes.  No history of cancer.   ENDOCRINE: Negative for cold or heat intolerance, polydipsia or goiter.   PSYCH:  ***depression or anxiety symptoms.   NEURO: As Above.   Vital Signs:  There were no vitals taken for this visit.   General Medical Exam:   General:  Well appearing, comfortable.   Eyes/ENT: see cranial nerve examination.   Neck: No masses appreciated.  Full range of motion without tenderness.  No carotid bruits. Respiratory:  Clear to auscultation, good air entry bilaterally.   Cardiac:  Regular rate and rhythm, no murmur.   Extremities:  No deformities, edema, or skin discoloration.  Skin:  No rashes or lesions.  Neurological Exam: MENTAL STATUS including orientation to time, place, person, recent and remote memory, attention span and concentration, language, and fund of knowledge is normal.  Speech is not dysarthric.  CRANIAL NERVES: II:  No visual field defects.  Unremarkable fundi.   III-IV-VI: Pupils equal round and reactive to light.   Normal conjugate, extra-ocular eye movements in all directions of gaze.  No nystagmus.  No ptosis.   V:  Normal facial sensation.     VII:  Normal facial symmetry and movements.  VIII:  Normal hearing and vestibular function.   IX-X:  Normal palatal movement.   XI:  Normal shoulder shrug and head rotation.   XII:  Normal tongue strength and range of motion, no deviation or fasciculation.  MOTOR:  No atrophy, fasciculations or abnormal movements.  No pronator drift.  Tone is normal.    Right Upper Extremity:    Left Upper Extremity:    Deltoid  5/5   Deltoid  5/5   Biceps  5/5   Biceps  5/5   Triceps  5/5   Triceps  5/5   Wrist extensors  5/5   Wrist extensors  5/5   Wrist flexors  5/5   Wrist flexors  5/5   Finger extensors  5/5   Finger extensors  5/5   Finger flexors  5/5   Finger flexors  5/5   Dorsal interossei  5/5   Dorsal interossei  5/5   Abductor pollicis  5/5   Abductor pollicis  5/5   Tone (Ashworth scale)  0  Tone (Ashworth scale)  0   Right Lower Extremity:    Left Lower Extremity:    Hip flexors  5/5   Hip flexors  5/5   Hip extensors  5/5   Hip extensors  5/5   Knee flexors  5/5   Knee flexors  5/5   Knee extensors  5/5   Knee extensors  5/5   Dorsiflexors  5/5   Dorsiflexors  5/5   Plantarflexors  5/5   Plantarflexors  5/5   Toe extensors  5/5   Toe extensors  5/5   Toe flexors  5/5   Toe flexors  5/5   Tone (Ashworth scale)  0  Tone (Ashworth scale)  0   MSRs:  Right                                                                 Left brachioradialis 2+  brachioradialis 2+  biceps 2+  biceps 2+  triceps 2+  triceps 2+  patellar 2+  patellar 2+  ankle jerk 2+  ankle jerk 2+  Hoffman no  Hoffman no  plantar response down  plantar response down   SENSORY:  Normal and symmetric perception of light touch, pinprick, vibration, and proprioception.  Romberg's sign absent.   COORDINATION/GAIT: Normal finger-to- nose-finger.  Intact rapid alternating movements  bilaterally. Gait narrow based and stable. Tandem and stressed gait intact.    IMPRESSION: ***  PLAN/RECOMMENDATIONS:  Check vitamin B12, folate, vitamin  B1, HbA1c *** NCS/EMG of the ***  Return to clinic in *** months.     Thank you for allowing me to participate in patient's care.  If I can answer any additional questions, I would be pleased to do so.    Sincerely,    Abisai Deer K. Allena Katz, DO

## 2018-07-17 ENCOUNTER — Encounter: Payer: Self-pay | Admitting: Neurology

## 2018-07-17 ENCOUNTER — Ambulatory Visit: Payer: BLUE CROSS/BLUE SHIELD | Admitting: Neurology

## 2018-07-17 ENCOUNTER — Encounter

## 2018-07-17 DIAGNOSIS — Z029 Encounter for administrative examinations, unspecified: Secondary | ICD-10-CM

## 2018-09-04 ENCOUNTER — Encounter (HOSPITAL_BASED_OUTPATIENT_CLINIC_OR_DEPARTMENT_OTHER): Payer: Self-pay

## 2018-09-04 ENCOUNTER — Ambulatory Visit (HOSPITAL_BASED_OUTPATIENT_CLINIC_OR_DEPARTMENT_OTHER)
Admission: RE | Admit: 2018-09-04 | Discharge: 2018-09-04 | Disposition: A | Discharge status: Home / Self Care | Payer: BLUE CROSS/BLUE SHIELD | Source: Ambulatory Visit | Attending: Family Medicine | Admitting: Family Medicine

## 2018-09-04 ENCOUNTER — Ambulatory Visit (HOSPITAL_BASED_OUTPATIENT_CLINIC_OR_DEPARTMENT_OTHER): Payer: BLUE CROSS/BLUE SHIELD

## 2018-09-04 DIAGNOSIS — Z1231 Encounter for screening mammogram for malignant neoplasm of breast: Secondary | ICD-10-CM | POA: Insufficient documentation

## 2018-09-04 DIAGNOSIS — Z1239 Encounter for other screening for malignant neoplasm of breast: Secondary | ICD-10-CM | POA: Diagnosis present

## 2018-09-06 ENCOUNTER — Other Ambulatory Visit: Payer: Self-pay | Admitting: Family Medicine

## 2018-09-06 DIAGNOSIS — R928 Other abnormal and inconclusive findings on diagnostic imaging of breast: Secondary | ICD-10-CM

## 2018-09-08 ENCOUNTER — Other Ambulatory Visit: Payer: Self-pay | Admitting: Family Medicine

## 2018-09-08 ENCOUNTER — Telehealth: Payer: Self-pay | Admitting: *Deleted

## 2018-09-08 DIAGNOSIS — N760 Acute vaginitis: Principal | ICD-10-CM

## 2018-09-08 DIAGNOSIS — B9689 Other specified bacterial agents as the cause of diseases classified elsewhere: Secondary | ICD-10-CM

## 2018-09-08 NOTE — Telephone Encounter (Signed)
Received Physician Orders from [facility]; forwarded to provider/SLS 03/06

## 2018-10-13 ENCOUNTER — Other Ambulatory Visit: Payer: BLUE CROSS/BLUE SHIELD

## 2018-11-10 ENCOUNTER — Ambulatory Visit
Admission: RE | Admit: 2018-11-10 | Discharge: 2018-11-10 | Disposition: A | Discharge status: Home / Self Care | Payer: BLUE CROSS/BLUE SHIELD | Source: Ambulatory Visit | Attending: Family Medicine | Admitting: Family Medicine

## 2018-11-10 ENCOUNTER — Other Ambulatory Visit: Payer: Self-pay

## 2018-11-10 ENCOUNTER — Other Ambulatory Visit: Payer: Self-pay | Admitting: Family Medicine

## 2018-11-10 DIAGNOSIS — R928 Other abnormal and inconclusive findings on diagnostic imaging of breast: Secondary | ICD-10-CM

## 2018-11-10 DIAGNOSIS — N63 Unspecified lump in unspecified breast: Secondary | ICD-10-CM

## 2019-05-14 ENCOUNTER — Other Ambulatory Visit: Payer: BLUE CROSS/BLUE SHIELD

## 2019-05-23 ENCOUNTER — Other Ambulatory Visit: Payer: BLUE CROSS/BLUE SHIELD

## 2019-05-30 ENCOUNTER — Other Ambulatory Visit: Payer: BLUE CROSS/BLUE SHIELD

## 2019-05-30 ENCOUNTER — Inpatient Hospital Stay: Admission: RE | Admit: 2019-05-30 | Payer: BLUE CROSS/BLUE SHIELD | Source: Ambulatory Visit

## 2019-08-29 ENCOUNTER — Ambulatory Visit
Admission: RE | Admit: 2019-08-29 | Discharge: 2019-08-29 | Disposition: A | Discharge status: Home / Self Care | Payer: 59 | Source: Ambulatory Visit | Attending: Family Medicine | Admitting: Family Medicine

## 2019-08-29 ENCOUNTER — Other Ambulatory Visit: Payer: Self-pay | Admitting: Family Medicine

## 2019-08-29 ENCOUNTER — Ambulatory Visit
Admission: RE | Admit: 2019-08-29 | Discharge: 2019-08-29 | Disposition: A | Discharge status: Home / Self Care | Payer: BLUE CROSS/BLUE SHIELD | Source: Ambulatory Visit | Attending: Family Medicine | Admitting: Family Medicine

## 2019-08-29 ENCOUNTER — Other Ambulatory Visit: Payer: Self-pay

## 2019-08-29 DIAGNOSIS — N63 Unspecified lump in unspecified breast: Secondary | ICD-10-CM

## 2019-11-28 ENCOUNTER — Ambulatory Visit
Admission: RE | Admit: 2019-11-28 | Discharge: 2019-11-28 | Disposition: A | Discharge status: Home / Self Care | Payer: 59 | Source: Ambulatory Visit | Attending: Family Medicine | Admitting: Family Medicine

## 2019-11-28 ENCOUNTER — Other Ambulatory Visit: Payer: Self-pay

## 2019-11-28 DIAGNOSIS — N63 Unspecified lump in unspecified breast: Secondary | ICD-10-CM

## 2021-04-18 IMAGING — MG MM DIGITAL DIAGNOSTIC UNILAT*L* W/ TOMO W/ CAD
4 series · 4 of 12 positions shown · non-contrast
Comparison: Previous exam(s).

CLINICAL DATA: Delayed follow-up for likely benign left breast
masses.

EXAM:
DIGITAL DIAGNOSTIC LEFT MAMMOGRAM WITH CAD AND TOMO
ULTRASOUND LEFT BREAST

[L CC synth-2D]
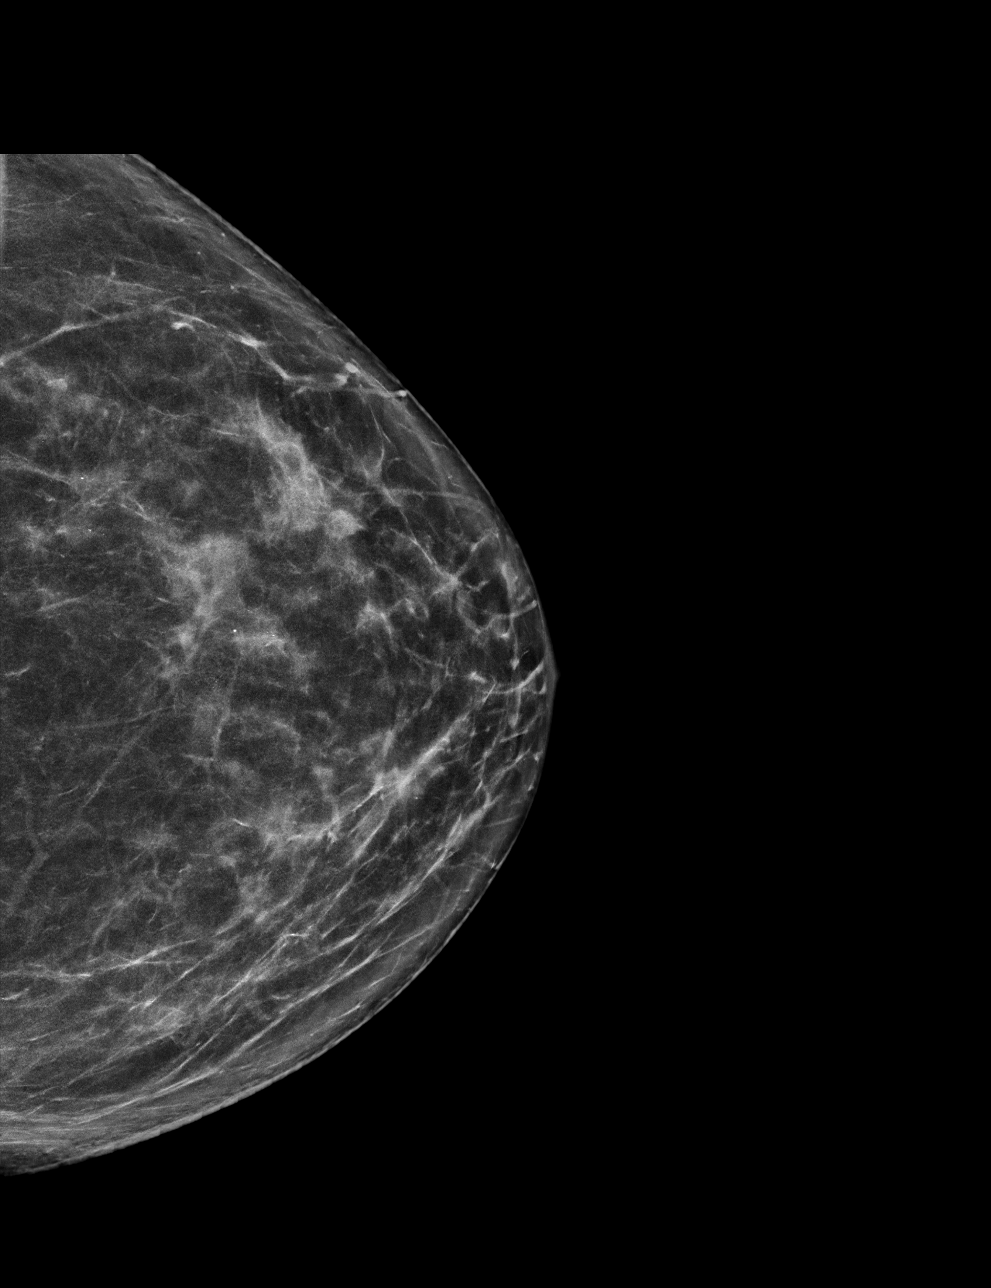

[L MLO synth-2D]
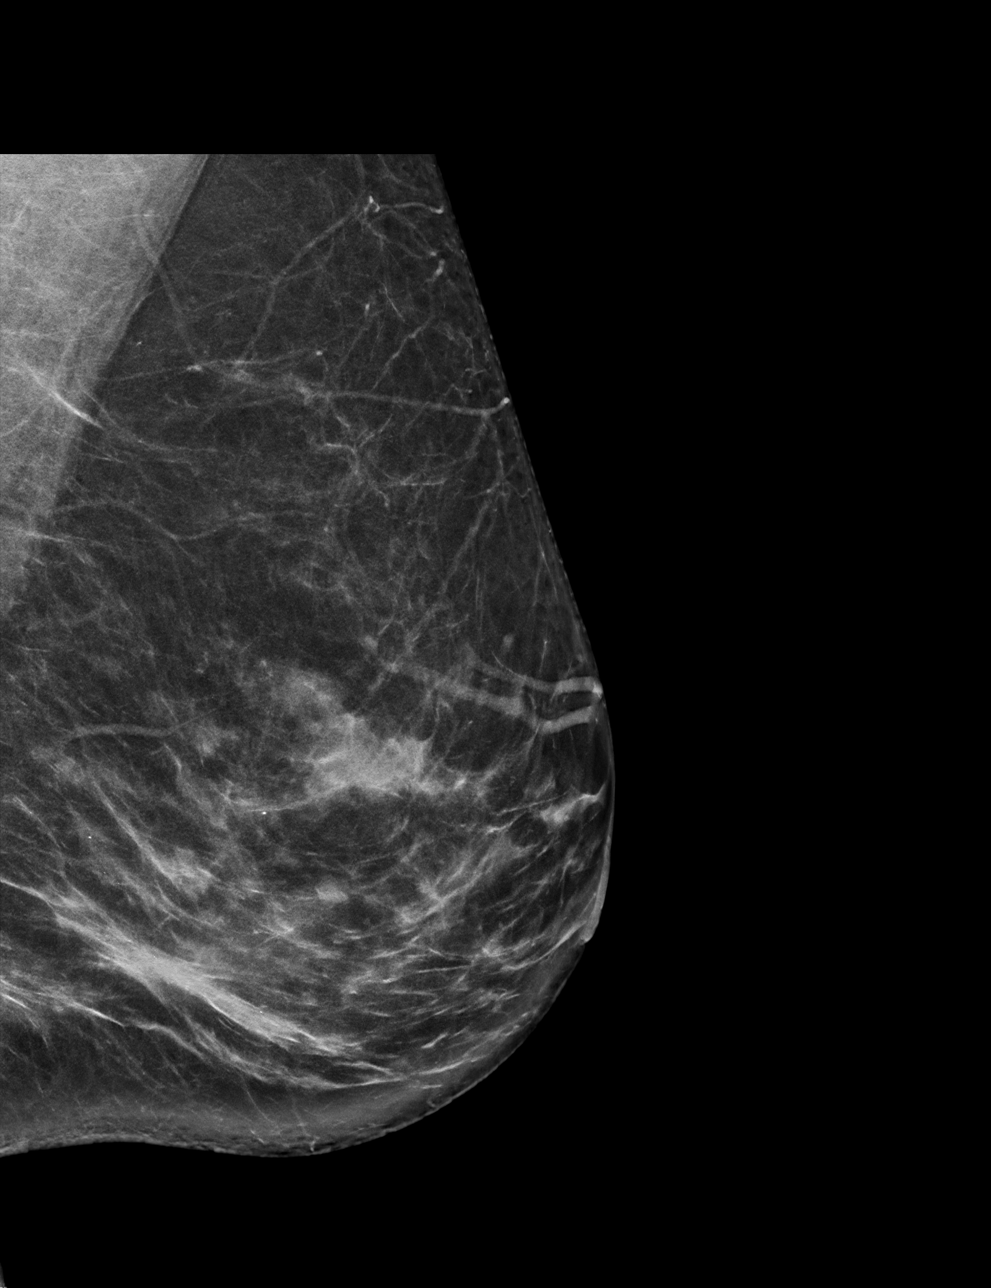

[L CC tomo · tomo slice 34/67.0]
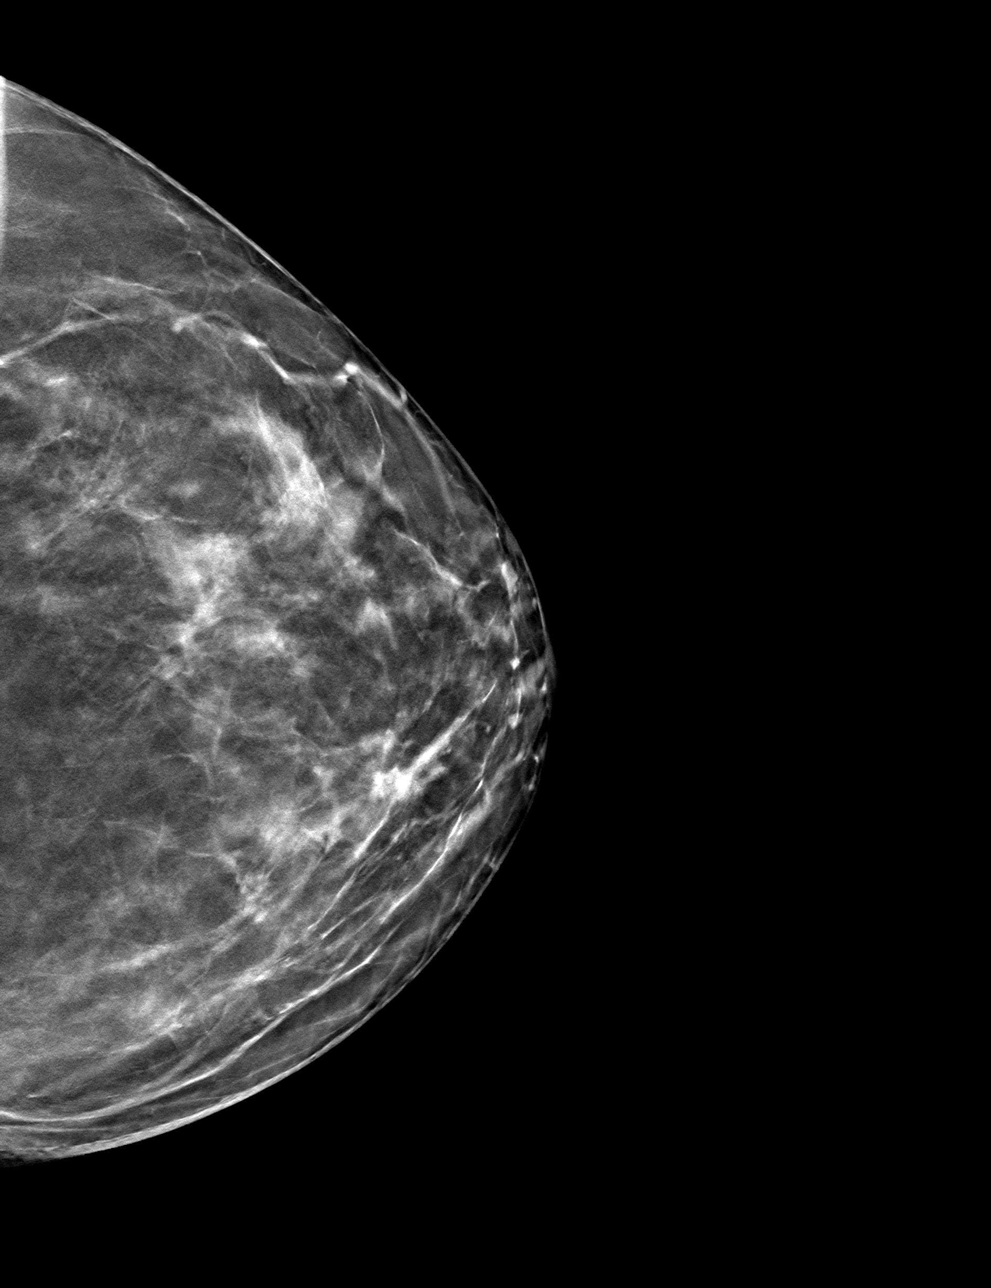

[L MLO tomo · tomo slice 35/70.0]
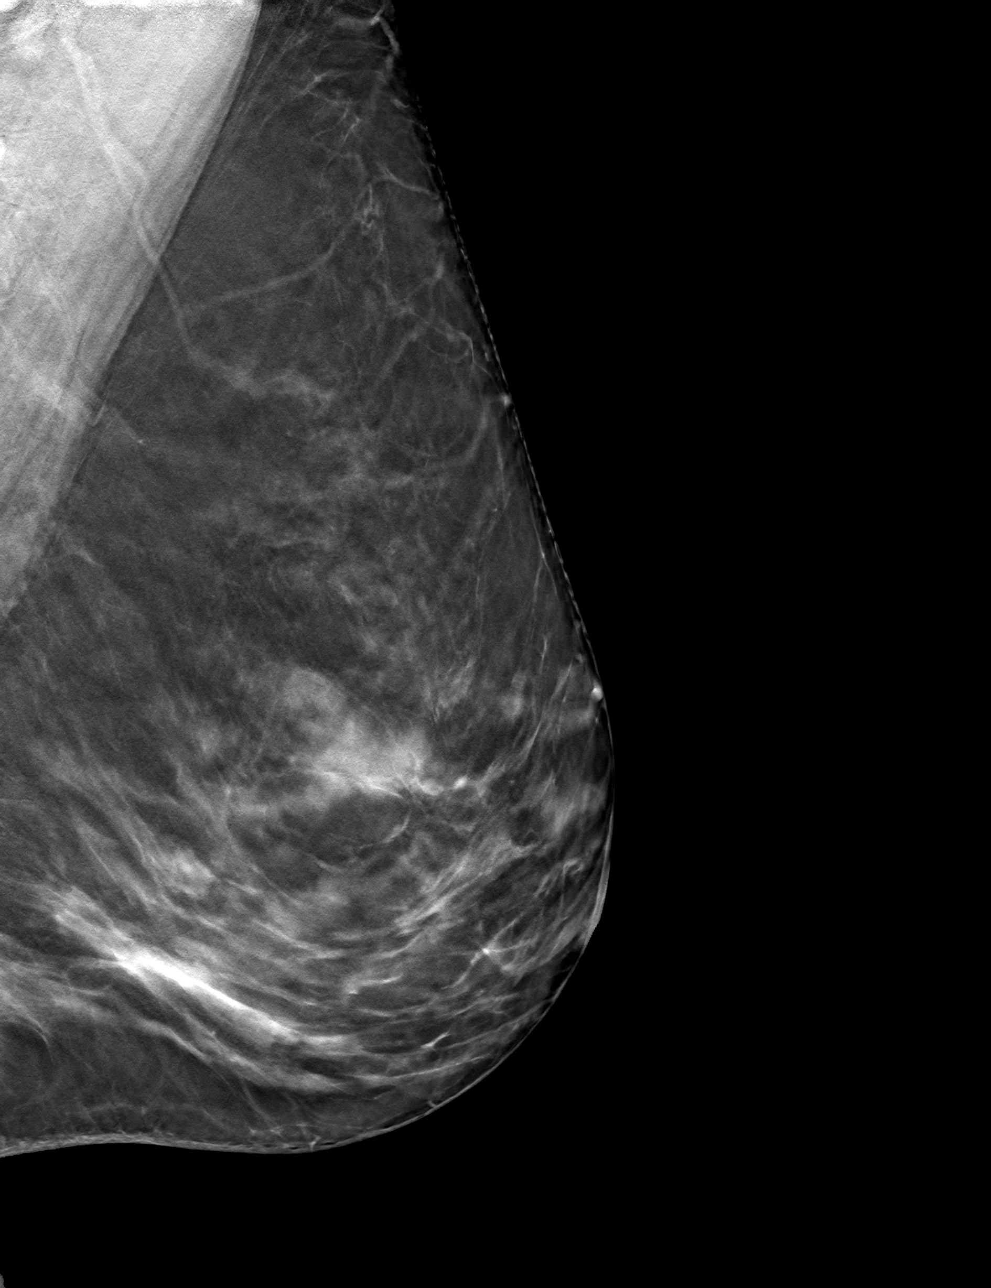

[4 of 12 positions shown; findings below may reference images not displayed]

ACR Breast Density Category c: The breast tissue is heterogeneously
dense, which may obscure small masses.
FINDINGS: The mass in the slightly lower outer quadrant of the left breast
appears mildly smaller than on the mammogram from 09/04/2018. No s
new uspicious calcifications, masses or areas of distortion are seen
in the left breast.

Mammographic images were processed with CAD.

Ultrasound targeted to the left breast at 4 o'clock, 2 cm from the
nipple demonstrates that the hypoechoic mass previously seen has
decreased in size measuring 5 x 4 x 5 mm, previously 6 x 5 x 5 mm.
The subtle area in the left breast at 4 o'clock, 3 cm from the
nipple now is compatible with fibrocystic change. There is no
discrete measurable mass.
IMPRESSION: 1. The likely benign left breast mass at 4 o'clock is stable to
slightly smaller.

2. The vague lesion of probable fibrocystic change of 4 o'clock now
is consistent with benign fibrocystic change. No further follow-up
is necessary.

RECOMMENDATION:
The patient is due for her bilateral mammogram in [REDACTED]. Therefore,
she should return for a bilateral diagnostic mammograms but
ultrasound is not need at that time. Presuming her mammogram is
stable, she will then return in Wednesday September, 2020 for her final
bilateral diagnostic mammogram and left breast ultrasound.

I have discussed the findings and recommendations with the patient.
If applicable, a reminder letter will be sent to the patient
regarding the next appointment.

BI-RADS CATEGORY  3: Probably benign.

## 2021-04-18 IMAGING — US US BREAST*L* LIMITED INC AXILLA
1 series · 12 of 12 positions shown · non-contrast
Comparison: Previous exam(s).

CLINICAL DATA: Delayed follow-up for likely benign left breast
masses.

EXAM:
DIGITAL DIAGNOSTIC LEFT MAMMOGRAM WITH CAD AND TOMO
ULTRASOUND LEFT BREAST

[Series 1: us breast*left* limited inc axilla · 0.06mm/px · 12 of 12 slices shown]
[im 1/12]
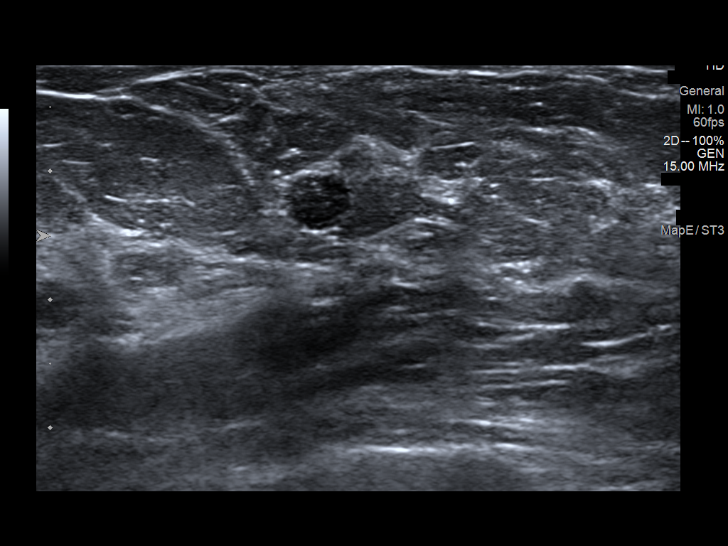
[im 2/12]
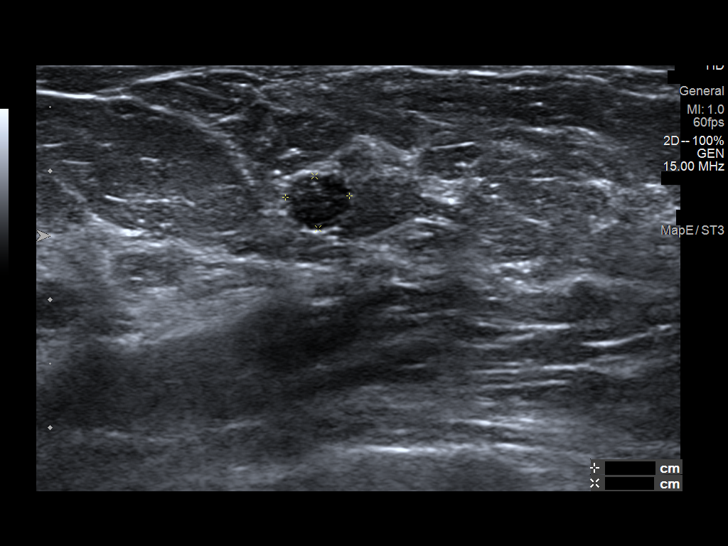
[im 3/12]
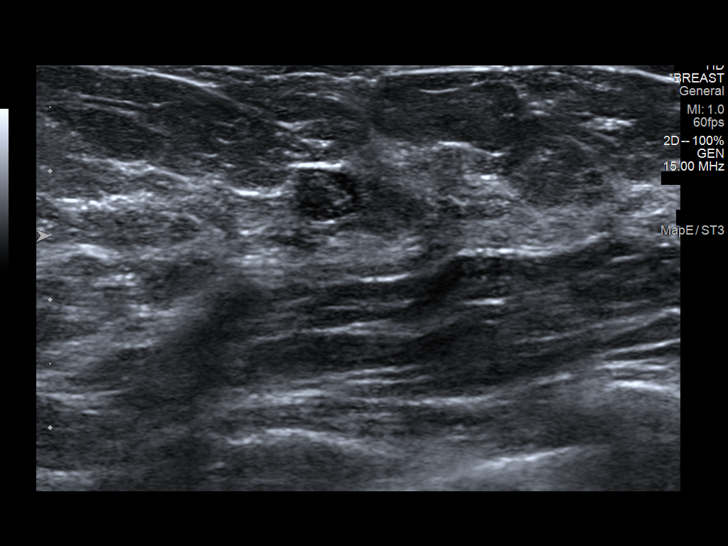
[im 4/12]
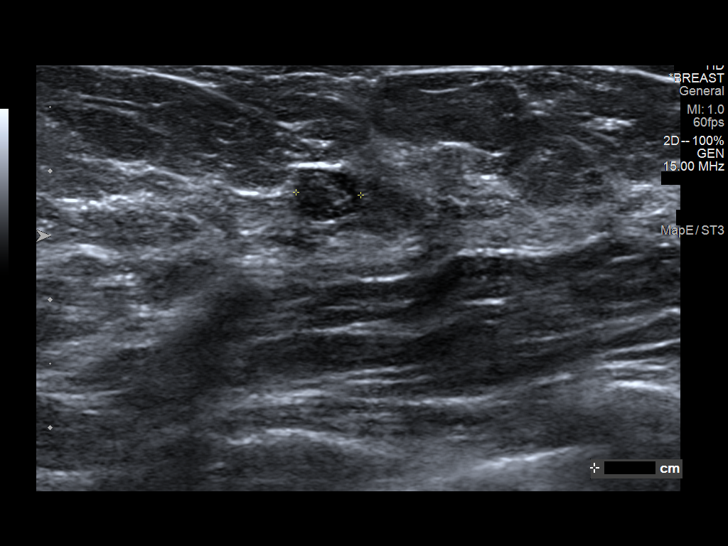
[im 5/12]
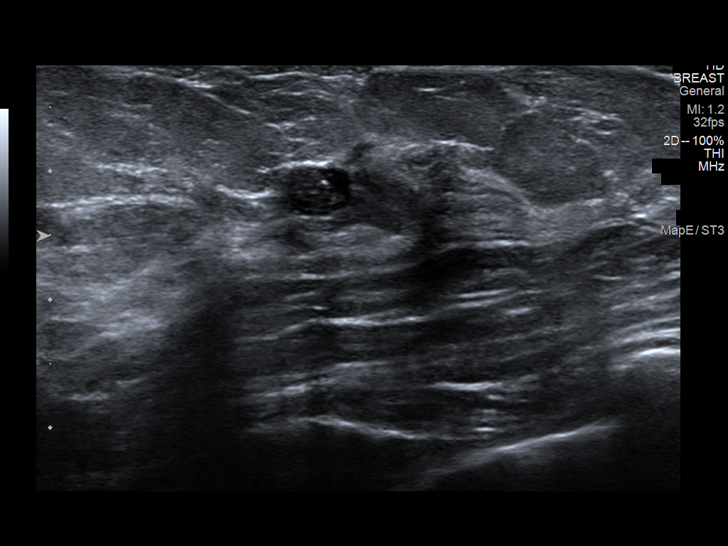
[im 6/12]
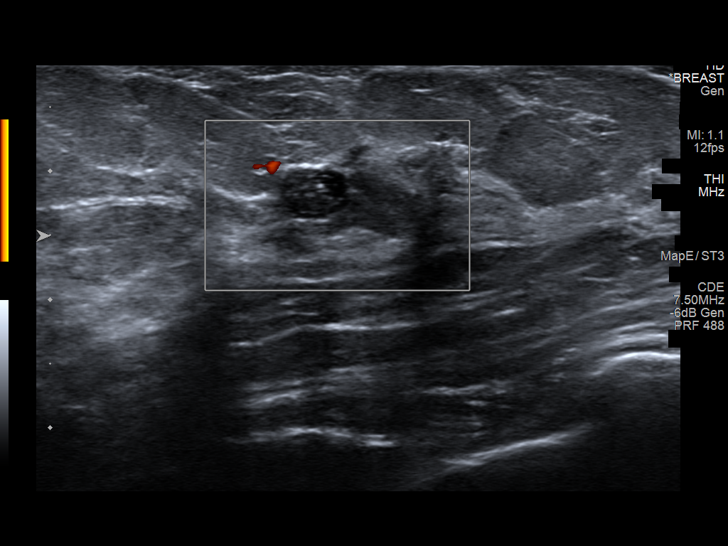
[im 7/12]
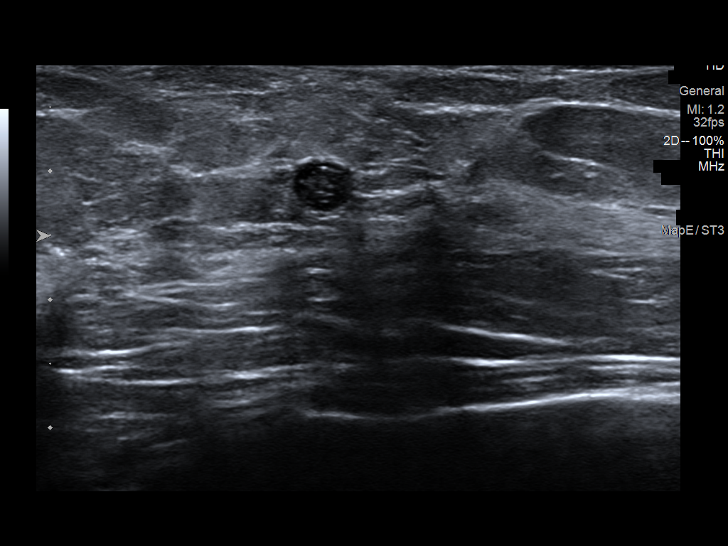
[im 8/12]
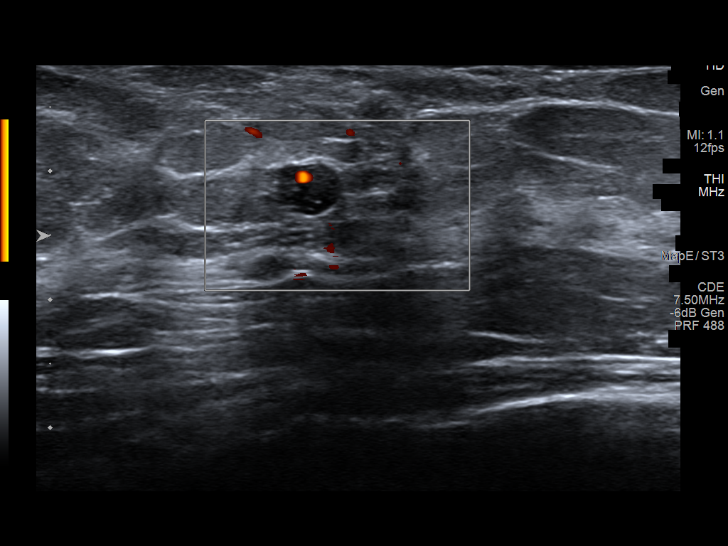
[im 9/12]
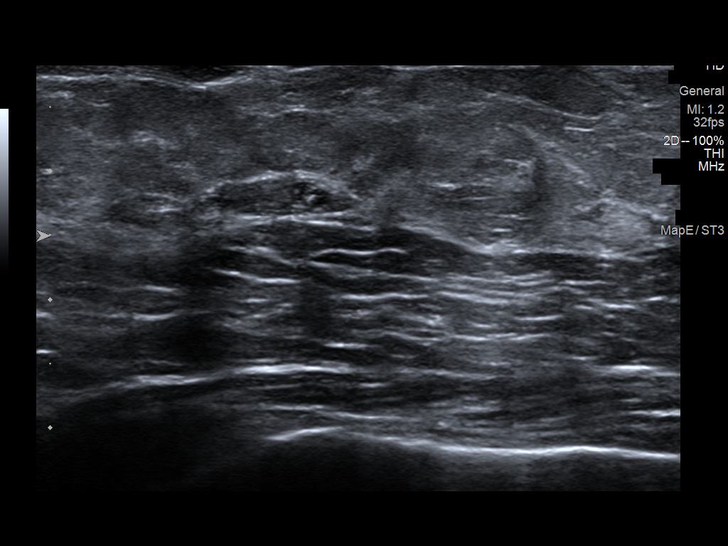
[im 10/12]
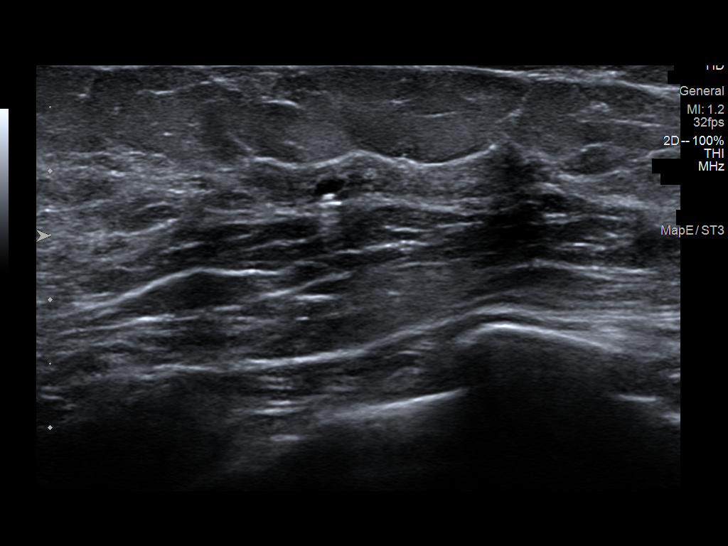
[im 11/12]
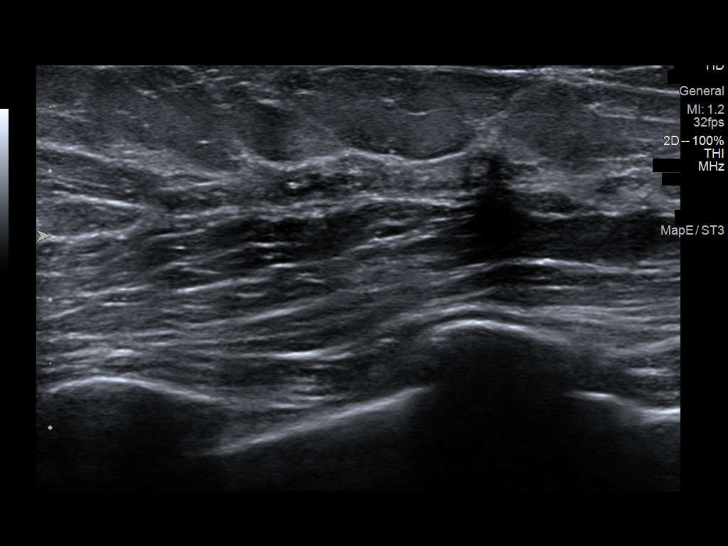
[im 12/12]
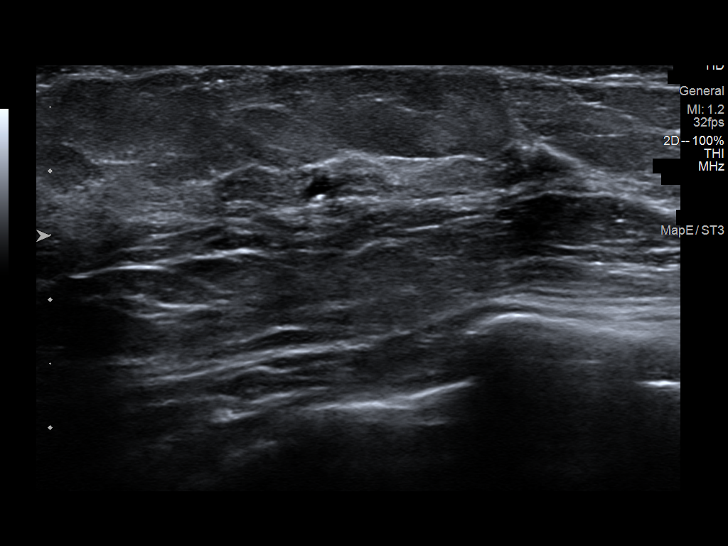

[12 of 12 positions shown; findings below may reference images not displayed]

ACR Breast Density Category c: The breast tissue is heterogeneously
dense, which may obscure small masses.
FINDINGS: The mass in the slightly lower outer quadrant of the left breast
appears mildly smaller than on the mammogram from 09/04/2018. No s
new uspicious calcifications, masses or areas of distortion are seen
in the left breast.

Mammographic images were processed with CAD.

Ultrasound targeted to the left breast at 4 o'clock, 2 cm from the
nipple demonstrates that the hypoechoic mass previously seen has
decreased in size measuring 5 x 4 x 5 mm, previously 6 x 5 x 5 mm.
The subtle area in the left breast at 4 o'clock, 3 cm from the
nipple now is compatible with fibrocystic change. There is no
discrete measurable mass.
IMPRESSION: 1. The likely benign left breast mass at 4 o'clock is stable to
slightly smaller.

2. The vague lesion of probable fibrocystic change of 4 o'clock now
is consistent with benign fibrocystic change. No further follow-up
is necessary.

RECOMMENDATION:
The patient is due for her bilateral mammogram in [REDACTED]. Therefore,
she should return for a bilateral diagnostic mammograms but
ultrasound is not need at that time. Presuming her mammogram is
stable, she will then return in Wednesday September, 2020 for her final
bilateral diagnostic mammogram and left breast ultrasound.

I have discussed the findings and recommendations with the patient.
If applicable, a reminder letter will be sent to the patient
regarding the next appointment.

BI-RADS CATEGORY  3: Probably benign.

## 2021-08-19 ENCOUNTER — Ambulatory Visit (INDEPENDENT_AMBULATORY_CARE_PROVIDER_SITE_OTHER): Payer: BC Managed Care – PPO | Admitting: Podiatry

## 2021-08-19 ENCOUNTER — Other Ambulatory Visit: Payer: Self-pay

## 2021-08-19 DIAGNOSIS — L600 Ingrowing nail: Secondary | ICD-10-CM

## 2021-08-19 MED ORDER — GENTAMICIN SULFATE 0.1 % EX CREA: 1.0000 "application " | TOPICAL_CREAM | Freq: Two times a day (BID) | CUTANEOUS | 1 refills | Status: AC

## 2021-08-19 NOTE — Progress Notes (Signed)
° °  Subjective: Patient presents today for evaluation of pain to the medial border of the bilateral great toes. Patient is concerned for possible ingrown nail.  It is very sensitive to touch.  Patient states that she normally goes to a nail salon to try to get the ingrown's trimmed out but it does not provide any sort of relief.  She has been dealing with ingrown toenails now for about a year and is very symptomatic and painful especially in close toed shoes.  Patient presents today for further treatment and evaluation.  Past Medical History:  Diagnosis Date   Eczema    MRSA (methicillin resistant Staphylococcus aureus)     Objective:  General: Well developed, nourished, in no acute distress, alert and oriented x3   Dermatology: Skin is warm, dry and supple bilateral.  Medial border bilateral great toes appears to be erythematous with evidence of an ingrowing nail. Pain on palpation noted to the border of the nail fold. The remaining nails appear unremarkable at this time. There are no open sores, lesions.  Vascular: Dorsalis Pedis artery and Posterior Tibial artery pedal pulses palpable. No lower extremity edema noted.   Neruologic: Grossly intact via light touch bilateral.  Musculoskeletal: Muscular strength within normal limits in all groups bilateral. Normal range of motion noted to all pedal and ankle joints.   Assesement: #1 Paronychia with ingrowing nail medial border bilateral great toes #2 Pain in toe  Plan of Care:  1. Patient evaluated.  2. Discussed treatment alternatives and plan of care. Explained nail avulsion procedure and post procedure course to patient. 3. Patient opted for permanent partial nail avulsion of the ingrown portion of the nail.  4. Prior to procedure, local anesthesia infiltration utilized using 3 ml of a 50:50 mixture of 2% plain lidocaine and 0.5% plain marcaine in a normal hallux block fashion and a betadine prep performed.  5. Partial permanent nail  avulsion with chemical matrixectomy performed using 3x30sec applications of phenol followed by alcohol flush.  6. Light dressing applied.  Post care instructions provided 7.  Prescription for gentamicin 2% cream  8.  Return to clinic 2 weeks.  Felecia Shelling, DPM [facility]  Dr. Felecia Shelling, DPM    [address] [place], [place] [address]                Office [phone]  Fax [phone]

## 2021-09-07 ENCOUNTER — Other Ambulatory Visit: Payer: Self-pay

## 2021-09-07 ENCOUNTER — Ambulatory Visit (INDEPENDENT_AMBULATORY_CARE_PROVIDER_SITE_OTHER): Payer: BC Managed Care – PPO | Admitting: Podiatry

## 2021-09-07 DIAGNOSIS — L600 Ingrowing nail: Secondary | ICD-10-CM | POA: Diagnosis not present

## 2021-09-14 NOTE — Progress Notes (Signed)
? ?  Subjective: ?56 y.o. female presents today status post permanent nail avulsion procedure of the medial border of the bilateral great toes that was performed on 08/19/2021.  Patient states that she is doing well however her right great toenail continues to be slightly tender.  Her left great toe is doing very well.  No new complaints this time.  ? ?Past Medical History:  ?Diagnosis Date  ? Eczema   ? MRSA (methicillin resistant Staphylococcus aureus)   ? ? ?Objective: ?Skin is warm, dry and supple. Nail and respective nail fold appears to be healing appropriately.  Well-healing nail matricectomy site.  There continues to be some very slight minor drainage to the medial border of the right great toe.  Minimal erythema around the nail matricectomy site as well. ? ?Assessment: ?#1 s/p partial permanent nail matrixectomy medial border bilateral great toes ? ? ?Plan of care: ?#1 patient was evaluated  ?#2 light debridement of open wound was performed to the periungual border of the respective toe using a currette. Antibiotic ointment and Band-Aid was applied. ?#3  Continue daily Epsom salt soaks and antibiotic gentamicin cream to the right great toe for an additional week  ?#4 patient is to return to clinic on a PRN basis. ? ? ?Felecia Shelling, DPM ?[facility] ? ?Dr. Felecia Shelling, DPM  ?  ?[address]                                      ?[place], [place] [address]                ?Office [phone]  ?Fax [phone] ? ? ? ? ?

## 2023-03-16 ENCOUNTER — Ambulatory Visit
Admission: EM | Admit: 2023-03-16 | Discharge: 2023-03-16 | Disposition: A | Discharge status: Home / Self Care | Payer: Self-pay

## 2023-03-16 ENCOUNTER — Emergency Department (HOSPITAL_BASED_OUTPATIENT_CLINIC_OR_DEPARTMENT_OTHER)
Admission: EM | Admit: 2023-03-16 | Discharge: 2023-03-17 | Disposition: A | Discharge status: Home / Self Care | Payer: Self-pay | Attending: Emergency Medicine | Admitting: Emergency Medicine

## 2023-03-16 ENCOUNTER — Other Ambulatory Visit: Payer: Self-pay

## 2023-03-16 ENCOUNTER — Encounter (HOSPITAL_BASED_OUTPATIENT_CLINIC_OR_DEPARTMENT_OTHER): Payer: Self-pay | Admitting: Emergency Medicine

## 2023-03-16 ENCOUNTER — Emergency Department (HOSPITAL_BASED_OUTPATIENT_CLINIC_OR_DEPARTMENT_OTHER): Payer: Self-pay

## 2023-03-16 DIAGNOSIS — M4802 Spinal stenosis, cervical region: Secondary | ICD-10-CM

## 2023-03-16 DIAGNOSIS — R202 Paresthesia of skin: Secondary | ICD-10-CM

## 2023-03-16 DIAGNOSIS — Z79899 Other long term (current) drug therapy: Secondary | ICD-10-CM | POA: Insufficient documentation

## 2023-03-16 DIAGNOSIS — R519 Headache, unspecified: Secondary | ICD-10-CM | POA: Insufficient documentation

## 2023-03-16 DIAGNOSIS — I10 Essential (primary) hypertension: Secondary | ICD-10-CM

## 2023-03-16 DIAGNOSIS — R200 Anesthesia of skin: Secondary | ICD-10-CM

## 2023-03-16 LAB — CBC WITH DIFFERENTIAL/PLATELET
Abs Immature Granulocytes: 0 10*3/uL (ref 0.00–0.07)
Basophils Absolute: 0.1 10*3/uL (ref 0.0–0.1)
Basophils Relative: 1 %
Eosinophils Absolute: 0.6 10*3/uL — ABNORMAL HIGH (ref 0.0–0.5)
Eosinophils Relative: 5 %
HCT: 40.4 % (ref 36.0–46.0)
Hemoglobin: 13.3 g/dL (ref 12.0–15.0)
Lymphocytes Relative: 43 %
Lymphs Abs: 5 10*3/uL — ABNORMAL HIGH (ref 0.7–4.0)
MCH: 31.9 pg (ref 26.0–34.0)
MCHC: 32.9 g/dL (ref 30.0–36.0)
MCV: 96.9 fL (ref 80.0–100.0)
Monocytes Absolute: 1.2 10*3/uL — ABNORMAL HIGH (ref 0.1–1.0)
Monocytes Relative: 10 %
Neutro Abs: 4.8 10*3/uL (ref 1.7–7.7)
Neutrophils Relative %: 41 %
Platelets: 232 10*3/uL (ref 150–400)
RBC Morphology: NONE SEEN
RBC: 4.17 MIL/uL (ref 3.87–5.11)
RDW: 13.7 % (ref 11.5–15.5)
WBC: 11.7 10*3/uL — ABNORMAL HIGH (ref 4.0–10.5)
nRBC: 0 % (ref 0.0–0.2)

## 2023-03-16 LAB — COMPREHENSIVE METABOLIC PANEL
ALT: 22 U/L (ref 0–44)
AST: 13 U/L — ABNORMAL LOW (ref 15–41)
Albumin: 4.1 g/dL (ref 3.5–5.0)
Alkaline Phosphatase: 60 U/L (ref 38–126)
Anion gap: 8 (ref 5–15)
BUN: 17 mg/dL (ref 6–20)
CO2: 26 mmol/L (ref 22–32)
Calcium: 9.2 mg/dL (ref 8.9–10.3)
Chloride: 107 mmol/L (ref 98–111)
Creatinine, Ser: 0.82 mg/dL (ref 0.44–1.00)
GFR, Estimated: >60 mL/min (ref >60)
Glucose, Bld: 100 mg/dL — ABNORMAL HIGH (ref 70–99)
Potassium: 4 mmol/L (ref 3.5–5.1)
Sodium: 141 mmol/L (ref 135–145)
Total Bilirubin: 0.3 mg/dL (ref 0.3–1.2)
Total Protein: 8 g/dL (ref 6.5–8.1)

## 2023-03-16 NOTE — ED Triage Notes (Signed)
Pt reports she is having left side facial numbness, jaw numbness,lip numbness and headache x 6 days   States she has been biting her tongue when she talks "like her tongue is in the way or something".

## 2023-03-16 NOTE — ED Provider Notes (Signed)
[facility]    CSN: [id number] Arrival date & time: 03/16/23  1758      History   Chief Complaint No chief complaint on file.   HPI Elizabeth Livingston is a 57 y.o. female.   Patient presents with approximately 6-day history of left-sided facial numbness,facial pain, headache.  Reports headache is located on the left side of her head and is rated 10/10 on pain scale.  Reports symptoms have been constant since they started.  Denies any recent falls or head injury.  Patient reports she has baseline weakness and tingling in her extremities prior to these current symptoms starting.  Reports dizziness but denies nausea or vomiting.     Past Medical History:  Diagnosis Date   Eczema    MRSA (methicillin resistant Staphylococcus aureus)     Patient Active Problem List   Diagnosis Date Noted   Essential hypertension 01/23/2018    History reviewed. No pertinent surgical history.  OB History   No obstetric history on file.      Home Medications    Prior to Admission medications   Medication Sig Start Date End Date Taking? Authorizing Provider  amLODipine (NORVASC) 2.5 MG tablet Take 1 tablet (2.5 mg total) by mouth daily. 04/10/18   Donato Schultz, DO  atorvastatin (LIPITOR) 10 MG tablet Take 10 mg by mouth daily.    [provider]  clotrimazole (LOTRIMIN) 1 % cream Apply to affected area 2 times daily 12/13/17   Maczis, Elmer Sow, PA-C  fluocinonide cream (LIDEX) 0.05 % APP EXT AA BID 03/20/18   [provider]  gentamicin cream (GARAMYCIN) 0.1 % Apply 1 application topically 2 (two) times daily. 08/19/21   Felecia Shelling, DPM  hydrOXYzine (ATARAX/VISTARIL) 25 MG tablet Take 0.5-1 tablets (12.5-25 mg total) by mouth every 8 (eight) hours as needed for itching. 02/10/18   Donato Schultz, DO    Family History Family History  Problem Relation Age of Onset   Hypertension Mother    Stroke Mother    Hypertension Brother    Kidney  failure Brother    Diabetes Maternal Grandmother    Lupus Maternal Aunt     Social History Social History   Tobacco Use   Smoking status: Every Day   Smokeless tobacco: Never  Substance Use Topics   Alcohol use: Yes    Comment: occ   Drug use: No     Allergies   Patient has no known allergies.   Review of Systems Review of Systems Per HPI  Physical Exam Triage Vital Signs ED Triage Vitals [03/16/23 1838]  Encounter Vitals Group     BP 133/71     Systolic BP Percentile      Diastolic BP Percentile      Pulse Rate 87     Resp 18     Temp 98.1 F (36.7 C)     Temp Source Oral     SpO2 98 %     Weight      Height      Head Circumference      Peak Flow      Pain Score 5     Pain Loc      Pain Education      Exclude from Growth Chart    No data found.  Updated Vital Signs BP 133/71 (BP Location: Right Arm)   Pulse 87   Temp 98.1 F (36.7 C) (Oral)   Resp 18  SpO2 98%   Visual Acuity Right Eye Distance:   Left Eye Distance:   Bilateral Distance:    Right Eye Near:   Left Eye Near:    Bilateral Near:     Physical Exam Constitutional:      General: She is not in acute distress.    Appearance: Normal appearance. She is not toxic-appearing or diaphoretic.  HENT:     Head: Normocephalic and atraumatic.  Eyes:     Extraocular Movements: Extraocular movements intact.     Conjunctiva/sclera: Conjunctivae normal.     Pupils: Pupils are equal, round, and reactive to light.  Pulmonary:     Effort: Pulmonary effort is normal.  Neurological:     General: No focal deficit present.     Mental Status: She is alert and oriented to person, place, and time. Mental status is at baseline.     Cranial Nerves: Cranial nerves 2-12 are intact.     Sensory: Sensation is intact.     Motor: Motor function is intact.     Coordination: Coordination is intact.     Gait: Gait is intact.  Psychiatric:        Mood and Affect: Mood normal.        Behavior: Behavior  normal.        Thought Content: Thought content normal.        Judgment: Judgment normal.      UC Treatments / Results  Labs (all labs ordered are listed, but only abnormal results are displayed) Labs Reviewed - No data to display  EKG   Radiology No results found.  Procedures Procedures (including critical care time)  Medications Ordered in UC Medications - No data to display  Initial Impression / Assessment and Plan / UC Course  I have reviewed the triage vital signs and the nursing notes.  Pertinent labs & imaging results that were available during my care of the patient were reviewed by me and considered in my medical decision making (see chart for details).     Given associated severe headache and left-sided facial numbness, recommended CT imaging and emergent evaluation at the ER.  Patient was agreeable with this plan.  Vital signs and neuroexam stable at discharge.  Agree with patient self transport to the ER. Final Clinical Impressions(s) / UC Diagnoses   Final diagnoses:  Left facial numbness  Acute nonintractable headache, unspecified headache type     Discharge Instructions      Go to the emergency department as soon as you leave urgent care for further evaluation and management.    ED Prescriptions   None    PDMP not reviewed this encounter.   Gustavus Bryant, [place] 03/16/23 1900

## 2023-03-16 NOTE — Discharge Instructions (Signed)
Go to the emergency department as soon as you leave urgent care for further evaluation and management. 

## 2023-03-16 NOTE — ED Triage Notes (Signed)
  Patient comes in POV with L sided facial numbness and tingling that started last week after a bad headache.  Patient states the left side of face feels numb and she keeps biting her tongue.  No issues with ambulation.  Patient states she has been having headaches off and on for several months and taking medication for hypertension.  No neuro deficits seen at this time.  Pain 6/10, headache.

## 2023-03-16 NOTE — ED Notes (Signed)
Introduced self to patient. Patient is in hospital gown, placed on cardiac monitor, updated vitals signs obtained, side rails are up, stretcher lowered and in the locked position, call light within the patients reach and instructed to use if they require assistance or have questions. Both EDP and patient has been updated on the status of their care.

## 2023-03-16 NOTE — ED Provider Notes (Signed)
EMERGENCY DEPARTMENT AT [facility] Provider Note   CSN: [id number] Arrival date & time: 03/16/23  1948     History  Chief Complaint  Patient presents with   Numbness    Elizabeth Livingston is a 57 y.o. female.  The history is provided by the patient and medical records. No language interpreter was used.  Neurologic Problem This is a new problem. The current episode started more than 2 days ago. The problem occurs constantly. The problem has not changed since onset.Associated symptoms include headaches. Pertinent negatives include no chest pain, no abdominal pain and no shortness of breath. Nothing aggravates the symptoms. Nothing relieves the symptoms. She has tried nothing for the symptoms. The treatment provided no relief.       Home Medications Prior to Admission medications   Medication Sig Start Date End Date Taking? Authorizing Provider  amLODipine (NORVASC) 2.5 MG tablet Take 1 tablet (2.5 mg total) by mouth daily. 04/10/18   Donato Schultz, DO  atorvastatin (LIPITOR) 10 MG tablet Take 10 mg by mouth daily.    [provider]  clotrimazole (LOTRIMIN) 1 % cream Apply to affected area 2 times daily 12/13/17   Maczis, Elmer Sow, PA-C  fluocinonide cream (LIDEX) 0.05 % APP EXT AA BID 03/20/18   [provider]  gentamicin cream (GARAMYCIN) 0.1 % Apply 1 application topically 2 (two) times daily. 08/19/21   Felecia Shelling, DPM  hydrOXYzine (ATARAX/VISTARIL) 25 MG tablet Take 0.5-1 tablets (12.5-25 mg total) by mouth every 8 (eight) hours as needed for itching. 02/10/18   Donato Schultz, DO      Allergies    Patient has no known allergies.    Review of Systems   Review of Systems  Constitutional:  Negative for chills, fatigue and fever.  HENT:  Negative for congestion.   Eyes:  Negative for visual disturbance.  Respiratory:  Negative for cough, chest tightness and shortness of breath.   Cardiovascular:  Negative for chest pain.   Gastrointestinal:  Negative for abdominal pain, constipation, diarrhea, nausea and vomiting.  Genitourinary:  Negative for dysuria.  Musculoskeletal:  Negative for back pain and neck pain.  Skin:  Negative for rash.  Neurological:  Positive for numbness and headaches. Negative for weakness and light-headedness.  Psychiatric/Behavioral:  Negative for agitation and confusion.   All other systems reviewed and are negative.   Physical Exam Updated Vital Signs BP (!) 155/98 (BP Location: Right Arm)   Pulse 87   Temp 97.8 F (36.6 C) (Oral)   Resp 18   Ht 5' (1.524 m)   Wt 61.2 kg   SpO2 99%   BMI 26.37 kg/m  Physical Exam Vitals and nursing note reviewed.  Constitutional:      General: She is not in acute distress.    Appearance: She is well-developed. She is not ill-appearing, toxic-appearing or diaphoretic.  HENT:     Head: Normocephalic and atraumatic.     Nose: Nose normal. No congestion or rhinorrhea.     Mouth/Throat:     Mouth: Mucous membranes are moist.     Pharynx: No oropharyngeal exudate or posterior oropharyngeal erythema.  Eyes:     Extraocular Movements: Extraocular movements intact.     Conjunctiva/sclera: Conjunctivae normal.     Pupils: Pupils are equal, round, and reactive to light.  Neck:     Vascular: No carotid bruit.  Cardiovascular:     Rate and Rhythm: Normal rate and regular rhythm.  Pulses: Normal pulses.     Heart sounds: No murmur heard. Pulmonary:     Effort: Pulmonary effort is normal. No respiratory distress.     Breath sounds: Normal breath sounds. No wheezing, rhonchi or rales.  Chest:     Chest wall: No tenderness.  Abdominal:     General: Abdomen is flat.     Palpations: Abdomen is soft.     Tenderness: There is no abdominal tenderness. There is no guarding or rebound.  Musculoskeletal:        General: No swelling or tenderness.     Cervical back: Neck supple. No tenderness.  Skin:    General: Skin is warm and dry.      Capillary Refill: Capillary refill takes less than 2 seconds.     Findings: No erythema or rash.  Neurological:     Mental Status: She is alert.     Sensory: Sensory deficit present.     Motor: No weakness.     Coordination: Coordination normal.  Psychiatric:        Mood and Affect: Mood normal.     ED Results / Procedures / Treatments   Labs (all labs ordered are listed, but only abnormal results are displayed) Labs Reviewed  CBC WITH DIFFERENTIAL/PLATELET - Abnormal; Notable for the following components:      Result Value   WBC 11.7 (*)    Lymphs Abs 5.0 (*)    Monocytes Absolute 1.2 (*)    Eosinophils Absolute 0.6 (*)    All other components within normal limits  COMPREHENSIVE METABOLIC PANEL - Abnormal; Notable for the following components:   Glucose, Bld 100 (*)    AST 13 (*)    All other components within normal limits    EKG None  Radiology CT Head Wo Contrast  Result Date: 03/16/2023 CLINICAL DATA:  Neuro deficit, acute, stroke suspected, left facial numbness EXAM: CT HEAD WITHOUT CONTRAST TECHNIQUE: Contiguous axial images were obtained from the base of the skull through the vertex without intravenous contrast. RADIATION DOSE REDUCTION: This exam was performed according to the departmental dose-optimization program which includes automated exposure control, adjustment of the mA and/or kV according to patient size and/or use of iterative reconstruction technique. COMPARISON:  None Available. FINDINGS: Brain: Normal anatomic configuration. No abnormal intra or extra-axial mass lesion or fluid collection. No abnormal mass effect or midline shift. No evidence of acute intracranial hemorrhage or infarct. Ventricular size is normal. Cerebellum unremarkable. Vascular: Unremarkable Skull: Intact Sinuses/Orbits: Paranasal sinuses are clear. Orbits are unremarkable. Other: Mastoid air cells and middle ear cavities are clear. IMPRESSION: 1. No acute intracranial hemorrhage or  infarct. Electronically Signed   By: Helyn Numbers M.D.   On: 03/16/2023 21:20    Procedures Procedures    Medications Ordered in ED Medications - No data to display  ED Course/ Medical Decision Making/ A&P                                 Medical Decision Making Amount and/or Complexity of Data Reviewed Labs: ordered. Radiology: ordered.    Elizabeth Livingston is a 57 y.o. female with a past medical history significant for eczema, hypertension, and some reportedly chronic numbness in extremities who presents with facial numbness and headache.  According to patient, about a week ago she started having left lower facial numbness that has caused her to bite her tongue occasionally and says she is having  abnormal speech with it.  She reports she is not having any weakness in any facial droop but reports that the left lower face still feels numb.  She denies any facial pain or rash to suggest shingles.  She reports has had headache over the last 2 weeks or so that feels different than previous headaches.  She denies any head trauma.  She reports that she has had numbness in her extremities in both arms and intermittently in the legs over the years and has had some nerve workup she reports that were reassuring.  She said that her left arm feels more numb than the right currently.  She denies any fevers, chills, chest pain, palpitations, nausea, vomiting, constipation, diarrhea, or urinary changes.  She reports some chronic shortness of breath but that is not different today.  No other complaints reported.  No vision changes reported.  On exam, lungs clear.  Chest nontender.  Abdomen nontender.  Patient has intact sensation and strength in lower extremities.  She has numbness in left arm compared to right but has symmetric grip strength.  Hoffmann sign negative in both arms.  Symmetric smile.  Clear speech.  She does have numbness in her left face compared to the right in the lower part.  Forehead is  spared with intact sensation throughout.  No rash.  Ears did not show evidence of otitis media or otitis externa.  No carotid bruit on exam.  No nuchal rigidity or neck stiffness.  Patient denying neck pain or back pain at this time.  Patient reports no family history of MS but does have a family history of lupus.  Denies other complaints.  Clinically is very strange that she has had 6 days of left facial numbness and some worsened left arm numbness in the setting of some worsened headaches.  She had a CT head in triage that was reassuring.  Other considerations include stroke that is not seen on CT or even MS.  Will discuss with neurology to create a plan.  10:09 PM Spoke to neurology who recommended getting MRI brain and C-spine with and without contrast to rule out stroke or MS.  Patient will be transferred to [facility] for MRIs tonight to rule out trouble.  Anticipate disposition based on findings.  Patient will be transferred, care and transfer excepted by Dr. Harland Dingwall team.         Final Clinical Impression(s) / ED Diagnoses Final diagnoses:  Numbness and tingling of left side of face  Left arm numbness    Clinical Impression: 1. Numbness and tingling of left side of face   2. Left arm numbness     Disposition: ED to ED transfer to get MRI brain and C-spine with and without contrast as recommended by neurology to rule out stroke or other concerning etiology of the numbness in the left face and left arm.  This note was prepared with assistance of Conservation officer, historic buildings. Occasional wrong-word or sound-a-like substitutions may have occurred due to the inherent limitations of voice recognition software.      [place] Rollyson, Canary Brim, MD 03/16/23 [phone]

## 2023-03-17 ENCOUNTER — Emergency Department (HOSPITAL_COMMUNITY): Payer: Self-pay

## 2023-03-17 MED ORDER — GADOBUTROL 1 MMOL/ML IV SOLN
6.0000 mL | Freq: Once | INTRAVENOUS | Status: AC | PRN
  Administered 2023-03-17: 6 mL via INTRAVENOUS

## 2023-03-17 MED ORDER — DEXAMETHASONE SODIUM PHOSPHATE 10 MG/ML IJ SOLN
10.0000 mg | Freq: Once | INTRAMUSCULAR | Status: AC
  Administered 2023-03-17: 10 mg via INTRAMUSCULAR
  Filled 2023-03-17: qty 1

## 2023-03-17 NOTE — Discharge Instructions (Addendum)
Your workup today shows stenosis at the C5-C6 region of your spine which is likely causing numbness in the left upper extremity.  I have provided contact information for neurosurgery.  Please schedule an appointment for evaluation.  I also placed a referral with neurology for further evaluation of your headaches and facial numbness.  If you develop any life-threatening symptoms please return to the emergency department.

## 2023-03-17 NOTE — ED Provider Notes (Signed)
  Physical Exam  BP (!) 135/100   Pulse 78   Temp 97.8 F (36.6 C) (Oral)   Resp 18   Ht 5' (1.524 m)   Wt 61.2 kg   SpO2 98%   BMI 26.37 kg/m   Physical Exam  Procedures  Procedures  ED Course / MDM    Medical Decision Making Amount and/or Complexity of Data Reviewed Labs: ordered. Radiology: ordered.  Risk Prescription drug management.   [facility].  Patient with chronic extremity numbness presented with facial numbness and headache.  For approximately 1 week she has had lower left facial numbness with some tongue biting.  She denies facial droop or weakness.   MRI brain and c-spine with and without contrast were ordered.  Normal MRI of brain.  1. Severe left C5-6 neural foraminal stenosis secondary to  uncovertebral hypertrophy.  2. No spinal canal stenosis.    The case was discussed with neurology. Dr. Derry Lory stated that the patient would be appropriate for outpatient follow up with neurosurgery and neurology.   I ordered the patient a dose of decadron to help with inflammation and.  Upon reassessment the patient in the same.  At this time patient is stable for discharge home with outpatient follow-up planned with both neurosurgery and neurology.  Patient provided return precautions.  No indication for emergent workup at this time.   Darrick Grinder, PA-C 03/17/23 0455    Gilda Crease, MD 03/18/23 [phone]

## 2023-03-17 NOTE — ED Notes (Signed)
Patient back from MRI.

## 2023-03-17 NOTE — ED Notes (Signed)
Patient transported to MRI 

## 2023-07-13 ENCOUNTER — Ambulatory Visit: Payer: Self-pay | Admitting: Diagnostic Neuroimaging

## 2023-07-13 ENCOUNTER — Encounter: Payer: Self-pay | Admitting: Diagnostic Neuroimaging
